# Patient Record
Sex: Male | Born: 1960 | Race: White | Hispanic: No
Health system: Western US, Academic
[De-identification: ages and names within clinical notes are randomized; demographics above are authoritative.]

## PROBLEM LIST (undated history)

## (undated) DIAGNOSIS — K759 Inflammatory liver disease, unspecified: Secondary | ICD-10-CM

## (undated) DIAGNOSIS — R012 Other cardiac sounds: Secondary | ICD-10-CM

## (undated) DIAGNOSIS — J309 Allergic rhinitis, unspecified: Secondary | ICD-10-CM

## (undated) DIAGNOSIS — J449 Chronic obstructive pulmonary disease, unspecified: Secondary | ICD-10-CM

## (undated) HISTORY — DX: Chronic obstructive pulmonary disease, unspecified: J44.9

## (undated) HISTORY — DX: Inflammatory liver disease, unspecified: K75.9

## (undated) HISTORY — DX: Allergic rhinitis, unspecified: J30.9

## (undated) HISTORY — DX: Other cardiac sounds: R01.2

## (undated) MED ORDER — PROAIR HFA 108 (90 BASE) MCG/ACT IN AERS
INHALATION_SPRAY | RESPIRATORY_TRACT | Status: AC
Start: 2014-11-28 — End: ?

---

## 2007-11-23 ENCOUNTER — Encounter (HOSPITAL_BASED_OUTPATIENT_CLINIC_OR_DEPARTMENT_OTHER): Payer: BLUE CROSS/BLUE SHIELD

## 2008-01-04 ENCOUNTER — Other Ambulatory Visit (HOSPITAL_BASED_OUTPATIENT_CLINIC_OR_DEPARTMENT_OTHER): Payer: BLUE CROSS/BLUE SHIELD

## 2008-01-04 ENCOUNTER — Encounter (HOSPITAL_BASED_OUTPATIENT_CLINIC_OR_DEPARTMENT_OTHER): Payer: BLUE CROSS/BLUE SHIELD

## 2008-01-04 ENCOUNTER — Other Ambulatory Visit (HOSPITAL_BASED_OUTPATIENT_CLINIC_OR_DEPARTMENT_OTHER): Payer: Self-pay | Admitting: Gastroenterology

## 2008-01-05 LAB — PR ULTRASOUND ABDOMINAL R-T W/IMAGE LIMITED

## 2008-01-06 LAB — PATHOLOGY, SURGICAL

## 2008-01-14 ENCOUNTER — Encounter (HOSPITAL_BASED_OUTPATIENT_CLINIC_OR_DEPARTMENT_OTHER): Payer: BLUE CROSS/BLUE SHIELD | Admitting: Gastroenterology

## 2011-10-02 ENCOUNTER — Ambulatory Visit (INDEPENDENT_AMBULATORY_CARE_PROVIDER_SITE_OTHER): Payer: BLUE CROSS/BLUE SHIELD | Admitting: Family

## 2011-10-02 ENCOUNTER — Encounter (INDEPENDENT_AMBULATORY_CARE_PROVIDER_SITE_OTHER): Payer: Self-pay | Admitting: Family

## 2011-10-02 ENCOUNTER — Encounter (INDEPENDENT_AMBULATORY_CARE_PROVIDER_SITE_OTHER): Payer: Self-pay

## 2011-10-02 VITALS — BP 152/81 | HR 63 | Temp 97.4°F | Resp 12 | Ht 73.0 in | Wt 216.0 lb

## 2011-10-02 LAB — X-RAY CHEST 2 VW

## 2011-10-02 LAB — PSA, DIAGNOSTIC/MONITORING: PSA, Diagnostic/Monitoring: 1.02 ng/mL (ref 0.00–4.00)

## 2011-10-02 LAB — COMPREHENSIVE METABOLIC PANEL
ALT (GPT): 63 U/L — ABNORMAL HIGH (ref 10–48)
AST (GOT): 33 U/L (ref 15–40)
Albumin: 3.8 g/dL (ref 3.5–5.2)
Alkaline Phosphatase (Total): 48 U/L (ref 39–139)
Anion Gap: 5 (ref 3–11)
Bilirubin (Total): 0.8 mg/dL (ref 0.2–1.3)
Calcium: 9.3 mg/dL (ref 8.9–10.2)
Carbon Dioxide, Total: 30 mEq/L (ref 22–32)
Chloride: 104 mEq/L (ref 98–108)
Creatinine: 0.85 mg/dL (ref 0.51–1.18)
GFR, Calc, African American: >60 mL/min (ref >59)
GFR, Calc, European American: >60 mL/min (ref >59)
Glucose: 80 mg/dL (ref 62–125)
Potassium: 4.4 mEq/L (ref 3.7–5.2)
Protein (Total): 6.6 g/dL (ref 6.0–8.2)
Sodium: 139 mEq/L (ref 136–145)
Urea Nitrogen: 12 mg/dL (ref 8–21)

## 2011-10-02 LAB — CBC, DIFF
% Basophils: 0 % (ref 0–1)
% Eosinophils: 2 % (ref 0–7)
% Immature Granulocytes: 0 % (ref 0–1)
% Lymphocytes: 24 % (ref 19–53)
% Monocytes: 12 % (ref 5–13)
% Neutrophils: 62 % (ref 34–71)
Absolute Eosinophil Count: 0.11 10*3/uL (ref 0.00–0.50)
Absolute Lymphocyte Count: 1.67 10*3/uL (ref 1.00–4.80)
Basophils: 0.03 10*3/uL (ref 0.00–0.20)
Hematocrit: 43 % (ref 38–50)
Hemoglobin: 14.8 g/dL (ref 13.0–18.0)
Immature Granulocytes: 0.01 10*3/uL (ref 0.00–0.05)
MCH: 32.3 pg (ref 27.3–33.6)
MCHC: 34.7 g/dL (ref 32.2–36.5)
MCV: 93 fL (ref 81–98)
Monocytes: 0.83 10*3/uL — ABNORMAL HIGH (ref 0.00–0.80)
Neutrophils: 4.41 10*3/uL (ref 1.80–7.00)
Platelet Count: 250 10*3/uL (ref 150–400)
RBC: 4.58 mil/uL (ref 4.40–5.60)
RDW-CV: 12.3 % (ref 11.6–14.4)
WBC: 7.06 10*3/uL (ref 4.3–10.0)

## 2011-10-02 LAB — TESTOSTERONE (ALL AGES): Testosterone: 3.9 ng/mL (ref 1.7–6.3)

## 2011-10-02 LAB — THYROID STIMULATING HORMONE: Thyroid Stimulating Hormone: 2.009 u[IU]/mL (ref 0.400–5.000)

## 2011-10-02 LAB — T3 (FREE): T3 (Free): 3.1 pg/mL (ref 2.3–3.9)

## 2011-10-02 LAB — T4, FREE: Thyroxine (Free): 0.7 ng/dL (ref 0.6–1.2)

## 2011-10-02 NOTE — Progress Notes (Signed)
SUBJECTIVE:    Ronald Page is a 50 year old male here for a wellness/preventive health care visit.     Additional concerns: heart rate drops. This happens several times per hour.  Doesn't stop, pause. Had EKG 5 yrs ago and it was normal. Really noticed it in [place] last week at [employer] in [place]. More fatigued lately. Was a smoker for awhile.     Current dietary habits: healthy diet in general ; red meat  Current exercise habits: yes, engages in regular exercise  Regular seat belt use: YES  Guns in the house: Yes: secured [employer] member  History sections of chart reviewed and updated today: yes    There are no active problems to display for this patient.      Current Outpatient Prescriptions   Medication Sig   . 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  OPHTHALMIC:  Need reading glasses;  Denies, any vision problems, blurry vision, diplopia, eye pain  ENT: no swallowing difficulties;  Denies and any ear nose or throat problems  RESPIRATORY:  No SOB when this occurs; no SOB with working out  Denies, dyspnea on exertion, dyspnea at rest, cough, wheezing  CARDIOVASCULAR: no swelling in the ankles; no Denies, chest pain or pressure at rest or during exercise, dyspnea, orthopnea, paroxysmal nocturnal dyspnea;   GI: Denies, change in appetite, dysphagia, nausea, vomiting, dyspepsia, abdominal pain, diarrhea, constipation, hematochezia, melena  GENITOURINARY: Denies, urethral discharge, erectile dysfunction, dysuria, any genito-urinary problems  INTEGUMENTARY: Denies, changes in moles or new moles, any skin problems  RHEUMATOLOGIC/MSK: Denies, joint swelling, joint stiffness, any joint or arthritic problems glucosamine/chondritin  ENDOCRINE: Denies, polyuria, polydipsia, heat intolerance, cold  intolerance no cold or head intolerance  PSYCHOSOCIAL: Denies, depressed mood, sleep disturbance and anxiety  Has been supplementing one coffee and three teas per day;       PHYSICAL EXAM:  BP 152/81  Pulse 63  Temp 97.4 F (36.3 C) (Oral)  Resp 12  Ht 6\' 1"  (1.854 m)  Wt 216 lb (97.977 kg)  BMI 28.50 kg/m2  SpO2 97%   General: no acute distress  Skin: Skin color, texture, turgor normal. No rashes or concerning lesions  Head: Normocephalic. No masses, lesions, tenderness or abnormalities  Eyes: Lids/periorbital skin normal, Conjunctivae/corneas clear, PERRL, EOM's intact  Ears: External ears normal. Canals clear. TM's normal.  Nose: normal  Oropharynx: normal  Teeth: normal dentition for age  Neck: supple. No adenopathy. Thyroid symmetric, normal size, without nodules  Lungs: clear to auscultation  Heart: no murmurs, clicks, or gallops, PMI normal and bradycardia noted on ekg. I did not hear nor see irregular heart rate at this time  Abd: soft, non-tender. BS normal. No masses or organomegaly  GU: Penis: no lesions or discharge. Testes: no masses or tenderness. No hernia.  Rectal: sphincter tone normal, no mass and stool guaiac negative (quality control done)  Prostate: normal size, no nodules or tenderness, symmetric  Spine: Back symmetric, no deformity; ROM normal; No CVA tenderness.  Ext: Normal, without deformities, edema, or skin discoloration, radial and DP pulses 2+ bilaterally  Neuro:  Grossly normal to observation, gait normal  -----------------------------------------    ASSESSMENT AND PLAN:  Health Maintenance   Topic Date Due   . Tetanus Booster  10/10/1970   . Cholesterol- Male  10/09/1993   . Colon Cancer Screening,fobt  10/09/2008   . Influenza > 6 Months  01/14/2012     Immunizations or studies due: None    No diagnosis found.     Preventive counseling: healthy dietary guidelines, proper exercise, and follow up with Cardiology October 16, 2011  Given history, will see if Cardiology wants me to  proceed with 24 hour holter monitor. Need to see results of the testing today first.  Ask him to avoid caffeine for now.

## 2011-10-02 NOTE — Patient Instructions (Addendum)
It was a pleasure to see you in clinic today.               I will send this information to your cardiologist   I will let you know results of your tests within 24 -48 hours  Please sign up for eCare so we can send them over the internet    Please journal your symptoms.  I am going to talk with cardiology about ordering the 24 hour holter monitor.   Should know by end of day.  If so, we will have you get this before July 3.    Be sure and drink 60 oz of fluids daily.      You can schedule an appointment to see [place] by calling [phone] or via eCare. If you are not yet signed up for eCare, please speak with a team member at the front desk.  eCare  enrollment will allow you to make appointments online, view test results and obtain a copy of our After Visit Summary.    If labs were ordered today the results are expected to be available via eCare 5 days later. If viewing your results on eCare, please make sure to scroll all the way to the bottom of the results to view any messages written by your provider.  If you have an active eCare account, this is where we will submit your results.  If you wish to receive the results over the phone or via letter, please call our office directly at [phone] and leave a message for your provider.    Otherwise, result letters are mailed 7-14 days after your tests are completed. If your physician needs to change your care based on your results, you will receive a phone call to notify you. If you haven't heard from him/her and it has been more than 14 days please give [place] a call.     Medication Refills: If you need to get a prescription refill, please contact your pharmacy 1 week before your current supply will run out to request the refill.  Contacting your pharmacy is the fastest and safest way to obtain a medication refill.  The pharmacy will notify our office.  Please note, that a minimum of 48 to 72 hours is needed to refill a medication.  Please call your pharmacy early to  allow enough time to refill before you anticipate running out.    We know you have a choice in where you receive your healthcare and we sincerely thank you for choosing [facility].

## 2011-10-03 ENCOUNTER — Encounter (INDEPENDENT_AMBULATORY_CARE_PROVIDER_SITE_OTHER): Payer: Self-pay | Admitting: Family

## 2011-10-04 LAB — VITAMIN D (25 HYDROXY)
Vit D (25_Hydroxy) Total: 22.7 ng/mL (ref 20.1–50.0)
Vitamin D2 (25_Hydroxy): <1 ng/mL
Vitamin D3 (25_Hydroxy): 22.7 ng/mL

## 2011-10-08 ENCOUNTER — Encounter (INDEPENDENT_AMBULATORY_CARE_PROVIDER_SITE_OTHER): Payer: Self-pay | Admitting: Family

## 2011-10-08 NOTE — Telephone Encounter (Signed)
Ronald Page see Pt's e-care message and advise

## 2011-10-09 NOTE — Telephone Encounter (Signed)
Please fax a copy of [name] ekg and clinic note to the [facility] Cardiology clinic where he will be seen on July 3.    Thank you.

## 2011-10-10 NOTE — Telephone Encounter (Signed)
Sent records to [facility] at [phone].

## 2011-10-10 NOTE — Telephone Encounter (Signed)
To [facility] to fax as below.

## 2011-10-16 ENCOUNTER — Ambulatory Visit: Payer: BLUE CROSS/BLUE SHIELD | Attending: Cardiovascular Disease

## 2011-10-16 DIAGNOSIS — I4949 Other premature depolarization: Secondary | ICD-10-CM | POA: Insufficient documentation

## 2011-11-15 ENCOUNTER — Ambulatory Visit (INDEPENDENT_AMBULATORY_CARE_PROVIDER_SITE_OTHER): Payer: BLUE CROSS/BLUE SHIELD | Admitting: Family

## 2011-11-15 ENCOUNTER — Encounter (INDEPENDENT_AMBULATORY_CARE_PROVIDER_SITE_OTHER): Payer: Self-pay | Admitting: Family

## 2011-11-15 ENCOUNTER — Ambulatory Visit (INDEPENDENT_AMBULATORY_CARE_PROVIDER_SITE_OTHER): Payer: Self-pay | Admitting: Family

## 2011-11-15 VITALS — BP 110/86 | HR 64 | Temp 97.4°F | Wt 214.0 lb

## 2011-11-15 MED ORDER — FLUCONAZOLE 150 MG OR TABS
ORAL_TABLET | ORAL | Status: DC
Start: 2011-11-15 — End: 2013-04-02

## 2011-11-15 MED ORDER — KETOCONAZOLE 2 % EX CREA
TOPICAL_CREAM | CUTANEOUS | Status: DC
Start: 2011-11-15 — End: 2013-04-02

## 2011-11-15 NOTE — Patient Instructions (Addendum)
It was a pleasure to see you in clinic today.               Avoid white sugar and white flour  Cinnamon pills may be helpful once daily     Keep skin away from skin  Power under arm and continue with other hygiene activities        You can schedule an appointment to see [place] by calling [phone] or via eCare. If you are not yet signed up for eCare, please speak with a team member at the front desk.  eCare  enrollment will allow you to make appointments online, view test results and obtain a copy of our After Visit Summary.    If labs were ordered today the results are expected to be available via eCare 5 days later. If viewing your results on eCare, please make sure to scroll all the way to the bottom of the results to view any messages written by your provider.  If you have an active eCare account, this is where we will submit your results.  If you wish to receive the results over the phone or via letter, please call our office directly at [phone] and leave a message for your provider.    Otherwise, result letters are mailed 7-14 days after your tests are completed. If your physician needs to change your care based on your results, you will receive a phone call to notify you. If you haven't heard from him/her and it has been more than 14 days please give [place] a call.     Medication Refills: If you need to get a prescription refill, please contact your pharmacy 1 week before your current supply will run out to request the refill.  Contacting your pharmacy is the fastest and safest way to obtain a medication refill.  The pharmacy will notify our office.  Please note, that a minimum of 48 to 72 hours is needed to refill a medication.  Please call your pharmacy early to allow enough time to refill before you anticipate running out.    We know you have a choice in where you receive your healthcare and we sincerely thank you for choosing [facility].     Index   Ringworm   What is ringworm?      Ringworm is a rash caused by a fungus that has infected your skin. (Despite its name, this rash is not caused by a worm or parasite.)   How does it occur?   Ringworm is spread by contact with an infected person or infected surface, such as clothes, towels, and bedding. It is more common among people participating in sports that involve a lot of contact with other people, such as wrestling. Children going to day care and people living in crowded conditions are also more likely to get ringworm.   Because ringworm is caused by a fungus it is more likely to occur in warm humid climates where fungus grows more easily.   Ringworm on the skin is called tinea corporis. When ringworm is on the feet, it is called tinea pedis, and when it is on the scalp, it is called tinea capitis. The fungus can also infect the inner thighs and groin. This type of ringworm is called jock itch or tinea cruris.   What are the symptoms?   The rash caused by a ringworm infection is usually round or oval and has a raised border. It starts small and slowly grows larger. As  it grows, the central part of the rash usually becomes clear. The rash may itch and the skin may become scaly. There may be some small, pus-filled bumps. Over time the rash spreads from one part of the body to other parts.   Ringworm on the scalp usually causes patches of hair loss.   How is it diagnosed?   Your healthcare provider will ask about your symptoms and examine you. Your provider may scrape the skin and look at it under a microscope or use an ultraviolet (UV) light to look for ringworm on the scalp.   How is it treated?   The treatment of ringworm depends on your health and how much the infection has spread on your skin or scalp. Most of the time putting an antifungal cream such as Tinactin, Micatin, or Lotrimin on the area of the rash once or twice a day is all that is needed. Rub the cream in well and apply it to an inch beyond the border of the rash. It's  important to keep using the medicine for a week after you no longer see a rash to make sure it's completely gone.   In some cases, your healthcare provider may prescribe a medicine to take by mouth. You may be given medicine, for example, if you have the rash on your scalp or in several places or if your immune system is weak.   How long will the effects last?   Ringworm may take several weeks to clear up with a cream, depending on the extent of the rash. If you are given an oral medicine, it may clear up faster. It is common to get it again after you've had it. Sometimes it becomes a long-term problem.   How can I take care of myself?    Try to keep your skin dry. Fungus likes to grow on moist skin.    Use the medicine as prescribed. If you are using the cream, remember to rub it in well.    For scalp infections, shampoo your hair every day. It may help to have your hair cut short but don't shave your head.    If you have ringworm in your beard and decide to shave your beard instead of just cutting it short, use an electric razor instead of a blade.   What can I do to help prevent ringworm?    Wash all your clothes, towels, and bedding that might have come into contact with the infection.    If you participate in sports such as wrestling, gymnastics, or martial arts, make sure the mats are cleaned regularly.    Don't share personal-care products or clothes with others if you or they have a rash.    Wash your hands thoroughly after touching an infected area.   Written by Arnette Norris, MD.   Published by [employer].   Last modified: 2007-11-30   Last reviewed: 2007-08-28   This content is reviewed periodically and is subject to change as new health information becomes available. The information is intended to inform and educate and is not a replacement for medical evaluation, advice, diagnosis or treatment by a healthcare professional.   Sports Medicine Advisor 2009.3 Index  Sports Medicine Advisor 2009.3  Credits    2009 [employer] and/or its affiliates. All Rights Reserved.

## 2011-11-15 NOTE — Progress Notes (Signed)
Ronald Page is  here with chief complaint:      #1 Rash under the arms  Duration Early June, 2013 and got worse while on [address]; very hot and perspiring significantly  Quality Itches, some burning; no sign of infection;   Timing: always there; not spreading any more  Severity: 2  Context: No prior history of rash; using neosporin but not improving; prediabetes note; patient changed diet while traveling; sitting outside long periods of time in heat;   Modifying factors: No change in deodorants, no change in soaps;        Past Medical History   Diagnosis Date   . Allergic rhinitis, cause unspecified    . Chronic obstructive asthma, unspecified    . Other abnormal heart sounds    . Hepatitis, unspecified      No past surgical history on file.    Patient Active Problem List   Diagnosis   . Bradycardia   . Fatigue   . Hepatitis C     Current Outpatient Prescriptions   Medication Sig   . ALBUTEROL IN prn       Allergies-Review of patient's allergies indicates no known allergies.    ROS: Please refer to HPI    PHYSICAL EXAM:  General: healthy, alert, no distress  Skin: Under each arm in axillary region there is a red, flakey rash round with six distinct annual well marginated areas on right and two on the left; no drainage or abcesses and no follicultis    Reviewed last glucose level in June, 2013 - level was 80.    ASSESSMENT/PLAN:      782.1 Rash and nonspecific skin eruption  (primary encounter diagnosis)  Comment: Tinea type  782.1 Rash and nonspecific skin eruption  (primary encounter diagnosis)    Plan: Fluconazole 150 MG Oral Tab, Ketoconazole 2 %         External Cream  Follow up as needed    Recommend avoiding white flour and white sugar                Refer to Patient instruction on AVS as well    Health Maintenance:

## 2011-11-21 ENCOUNTER — Other Ambulatory Visit: Payer: Self-pay

## 2011-12-20 ENCOUNTER — Encounter (INDEPENDENT_AMBULATORY_CARE_PROVIDER_SITE_OTHER): Payer: Self-pay | Admitting: Family

## 2011-12-21 NOTE — Telephone Encounter (Signed)
Medication Refill Documentation  Name of Medication Acyclovir  Prescribing Provider NA  Date last filled Historical  Date last seen for this issue Historical

## 2011-12-23 MED ORDER — ACYCLOVIR 800 MG OR TABS
ORAL_TABLET | ORAL | Status: DC
Start: 2011-12-23 — End: 2013-03-03

## 2011-12-23 NOTE — Telephone Encounter (Signed)
Rx was sent for ACYCLOVIR

## 2012-01-15 ENCOUNTER — Encounter (INDEPENDENT_AMBULATORY_CARE_PROVIDER_SITE_OTHER): Payer: Self-pay

## 2012-08-12 ENCOUNTER — Encounter (INDEPENDENT_AMBULATORY_CARE_PROVIDER_SITE_OTHER): Payer: Self-pay | Admitting: Family

## 2012-08-12 ENCOUNTER — Ambulatory Visit (INDEPENDENT_AMBULATORY_CARE_PROVIDER_SITE_OTHER): Payer: BLUE CROSS/BLUE SHIELD | Admitting: Family

## 2012-08-12 VITALS — BP 137/98 | HR 77 | Temp 98.5°F | Resp 12 | Ht 73.0 in | Wt 214.0 lb

## 2012-08-12 NOTE — Progress Notes (Signed)
Ronald Page is  here with chief complaint:      #1 Lesion on the right top of head x 1 year  Duration 1 yr  Quality changing slightly  Timing:   Severity:   Context:   Modifying factors:     #2: Blood pressure slightly elevated  Duration   Quality   Timing:   Severity:   Context:   Modifying factors:       Past Medical History   Diagnosis Date   . Allergic rhinitis, cause unspecified    . Chronic obstructive asthma, unspecified    . Other abnormal heart sounds    . Hepatitis, unspecified      No past surgical history on file.    Patient Active Problem List   Diagnosis   . Bradycardia   . Fatigue   . Hepatitis C     Current Outpatient Prescriptions   Medication Sig   . ALBUTEROL IN prn   . Fluconazole 150 MG Oral Tab 1 TABLET now   . Ketoconazole 2 % External Cream apply twice daily to affected area     No current facility-administered medications for this visit.       Allergies-Review of patient's allergies indicates no known allergies.    ROS: Please refer to HPI    PHYSICAL EXAM:  General: healthy, alert, no distress  HEAD: There is a flat, neutral colored irregular shaped mole on the right parietal region of his head and anterior section. Nontender and non-flaky. It is measures at 1 cm diameter. Blanches well. No discharge. There is some brown discoloration along 30% of the edges.  Skin: Skin color, texture, turgor normal. No rashes or concerning lesions except irregular flat brown mole on the right side of his face.    ASSESSMENT/PLAN:      (709.9) Skin lesion of scalp  (primary encounter diagnosis)  Plan: REFERRAL TO DERMATOLOGY  Explained the importance of having the mole on his head rechecked by dermatology. Possibly needs biopsy and complete removal.  I did perform cryotherapy 5 times on this lesion and he tolerated it well    (V77.91) Screening, lipid  Plan: CANCELED: Lipid Panel  Patient decided not to have these done today    (V02.62) Hepatitis C carrier  Plan: CANCELED: HEPATIC FUNCTION  PANEL  Patient decided not to have these done today              Refer to Patient instruction on AVS as well

## 2012-08-12 NOTE — Patient Instructions (Signed)
It was a pleasure to see you in clinic today.                  If you are not yet signed up for eCare, please speak with a team member at the front desk who would happy to assist you with this process.      eCare  enrollment will allow you access to the below benefits   You can make appointments online   View test results / Lab Results   Obtain a copy of our After Visit Summary (a summary of your visit today)     Your Test Results:  If labs were ordered today the results are expected to be available via eCare in about 5 days. If you have an active eCare account, this is how we will notify you of your results.     If you do not have an eCare account then your test results will be mailed to you within about 14 days after your tests are completed. If your physician needs to change your care based on your results or is concerned, you will receive a phone call.     If you have any questions about your test results please schedule an appointment with your provider.    **If it has been more than 2 weeks and you have not received your test results please send our office a message via eCare.    Medication Refills: If you need a prescription refilled, please contact your pharmacy 1 week before your current supply will run out to request the refill.  Contacting your pharmacy is the fastest and safest way to obtain a medication refill.  The pharmacy will notify our office.  Please note, that a minimum of 48 to 72 hours is needed to refill a medication, but refills are usually processed and sent to your pharmacy in about 5 business days.  Please call your pharmacy early to allow enough time to refill before you anticipate running out.  For faster medication refills, you can also schedule an appointment with your provider.    We know you have a choice in where you receive your healthcare and we sincerely thank you for trusting [facility] with your health.

## 2012-11-04 ENCOUNTER — Other Ambulatory Visit: Payer: Self-pay

## 2012-11-04 ENCOUNTER — Encounter (INDEPENDENT_AMBULATORY_CARE_PROVIDER_SITE_OTHER): Payer: Self-pay | Admitting: Family

## 2012-11-04 MED ORDER — ALBUTEROL SULFATE HFA 108 (90 BASE) MCG/ACT IN AERS
INHALATION_SPRAY | RESPIRATORY_TRACT | Status: DC
Start: 2012-11-04 — End: 2013-08-28

## 2012-11-04 NOTE — Telephone Encounter (Signed)
Medication Refill Documentation  Name of Medication Albuterol  Prescribing Provider Fonnie Mu, ARNP  Date last  filled Never looks like outside Dr  Date last seen for this issue 08/12/2012  Pharmacy loaded: Yes

## 2012-11-16 ENCOUNTER — Encounter (INDEPENDENT_AMBULATORY_CARE_PROVIDER_SITE_OTHER): Payer: Self-pay | Admitting: Family

## 2012-11-16 NOTE — Telephone Encounter (Signed)
Forwarding to [name] Meyer and the referral pool.

## 2012-11-16 NOTE — Telephone Encounter (Signed)
I completed the referral and let the patient know.      Thank you

## 2012-11-16 NOTE — Telephone Encounter (Signed)
Pleas see pt message below. Is it ok for me to send referral ?

## 2012-11-17 NOTE — Telephone Encounter (Signed)
Thank you :)

## 2012-12-19 ENCOUNTER — Encounter (INDEPENDENT_AMBULATORY_CARE_PROVIDER_SITE_OTHER): Payer: Self-pay | Admitting: Family

## 2012-12-28 ENCOUNTER — Encounter (HOSPITAL_BASED_OUTPATIENT_CLINIC_OR_DEPARTMENT_OTHER): Payer: Self-pay

## 2012-12-28 ENCOUNTER — Ambulatory Visit: Payer: BLUE CROSS/BLUE SHIELD | Attending: Internal Medicine | Admitting: Gastroenterology

## 2012-12-28 VITALS — BP 150/89 | HR 74 | Temp 95.7°F | Ht 72.0 in | Wt 213.2 lb

## 2012-12-28 DIAGNOSIS — B1920 Unspecified viral hepatitis C without hepatic coma: Secondary | ICD-10-CM | POA: Insufficient documentation

## 2012-12-28 DIAGNOSIS — B182 Chronic viral hepatitis C: Secondary | ICD-10-CM | POA: Insufficient documentation

## 2012-12-28 LAB — CBC (HEMOGRAM)
Hematocrit: 42 % (ref 38–50)
Hemoglobin: 14.8 g/dL (ref 13.0–18.0)
MCH: 32.3 pg (ref 27.3–33.6)
MCHC: 35.1 g/dL (ref 32.2–36.5)
MCV: 92 fL (ref 81–98)
Platelet Count: 235 10*3/uL (ref 150–400)
RBC: 4.58 mil/uL (ref 4.40–5.60)
RDW-CV: 12.4 % (ref 11.6–14.4)
WBC: 7.99 10*3/uL (ref 4.3–10.0)

## 2012-12-28 LAB — HEPATIC FUNCTION PANEL
ALT (GPT): 66 U/L — ABNORMAL HIGH (ref 10–48)
AST (GOT): 41 U/L — ABNORMAL HIGH (ref 15–40)
Albumin: 3.8 g/dL (ref 3.5–5.2)
Alkaline Phosphatase (Total): 44 U/L (ref 39–139)
Bilirubin (Direct): 0.1 mg/dL (ref 0.0–0.3)
Bilirubin (Total): 0.5 mg/dL (ref 0.2–1.3)
Protein (Total): 7 g/dL (ref 6.0–8.2)

## 2012-12-28 LAB — BASIC METABOLIC PANEL
Anion Gap: 7 (ref 3–11)
Calcium: 9 mg/dL (ref 8.9–10.2)
Carbon Dioxide, Total: 24 mEq/L (ref 22–32)
Chloride: 107 mEq/L (ref 98–108)
Creatinine: 1.13 mg/dL (ref 0.51–1.18)
GFR, Calc, African American: >60 mL/min (ref >59)
GFR, Calc, European American: >60 mL/min (ref >59)
Glucose: 154 mg/dL — ABNORMAL HIGH (ref 62–125)
Potassium: 3.9 mEq/L (ref 3.7–5.2)
Sodium: 138 mEq/L (ref 136–145)
Urea Nitrogen: 13 mg/dL (ref 8–21)

## 2012-12-28 LAB — PROTHROMBIN TIME
Prothrombin INR: 1 (ref 0.8–1.3)
Prothrombin Time Patient: 13.2 s (ref 10.7–15.6)

## 2012-12-28 NOTE — Progress Notes (Addendum)
HEPATOLOGY CLINIC NOTE    PROVIDERS  PCP Eliane Decree    REASON FOR CONSULT  Hepatits C; consideration for treatment    IDENTIFICATION/CHIEF COMPLAINT  [name] is a 52 year old male with a PMH of chronic hepatitis C, genotype 1, grade 1, stage 0 based on percutaneous liver biopsy 12/2007, sent in consultation by Ms. Meyer today to discuss hepatitis C treatment options.    HPI  [name] is a 52 year old male as above with a PMH of exercise-induced asthma here today to discuss hepatitis C treatment.    Patient was initially seen in hepatology clinic 12/2007.  He was diagnosed with HCV, genotype 1, near 1990 after attempting to donate blood.  He reports contracting the virus from a few possible different options including rare intranasal cocaine use in the 1980s, a tattoo he obtained in the 1980s, and blood products he received as part of surgery for a ruptured appendix in the early 1980s.  He has not had any obvious complications from HCV and previously delayed treatment as he felt well and his disease activity was minimal.  Per previous notes, HCV viral loads have hovered in the 100,000 range but the exact number and timing of these tests are unknown.    Patient reports feeling very well since he was last seen in hepatology clinic 01/2008.  Although he notes fatigue, he is very active and exercises nearly every day during the week.  He has continued to feel well without obvious complications from or evidence of decompensated disease or cirrhosis.  He has a good appetite.  His exercise is a mix of aerobic and resistance training.  He eats healthy.  No chest pain/SOB except for mild exercise or cold-weather related asthma.  No nausea or vomiting.  No abdominal pain.  No fever or chills.  Good energy levels with exercise although no longer with the same level of stamina he previously noted.    Patient is very hopeful of having his HCV treated.  He is otherwise feeling and doing well  today.    PAST MEDICAL HISTORY  Patient Active Problem List    Diagnosis Date Noted   . Bradycardia [427.89] 10/02/2011   . Fatigue [780.79] 10/02/2011   . Hepatitis C [070.70] 10/02/2011     Past Medical History   Diagnosis Date   . Allergic rhinitis, cause unspecified    . Chronic obstructive asthma, unspecified    . Other abnormal heart sounds    . Hepatitis, unspecified      ALLERGIES  Review of patient's allergies indicates:  No Known Allergies    MEDICATIONS  Current Outpatient Prescriptions   Medication Sig   . Albuterol Sulfate HFA 108 (90 BASE) MCG/ACT Inhalation Aero Soln 2 puffs 15 minutes before exercise OR every 4 hours as needed for asthma   . Fluconazole 150 MG Oral Tab 1 TABLET now   . Ketoconazole 2 % External Cream apply twice daily to affected area     No current facility-administered medications for this visit.     SOCIAL HISTORY  History   Substance Use Topics   . Smoking status: Former Games developer   . Smokeless tobacco: Not on file   . Alcohol Use: Yes     Used intranasal cocaine and marijuana in the 1980s but none since.  Obtained a tattoo in the 1980s.  Received blood products via transfusion secondary to a ruptured appendix in the early 1980s.  Drinks 2  to 4 glasses of EtOH/year.    His wife is a former Facilities manager at [employer] and has just transitioned to a new position in [place].    FAMILY HISTORY  No known FH of GI/liver disease or malignancy.    REVIEW OF SYSTEMS  A complete ROS was done and was negative other than per HPI above.    PHYSICAL EXAM  Filed Vitals:    12/28/12 1404   BP: 150/89   Pulse: 74   Temp: 95.7 F (35.4 C)   TempSrc: Temporal   Height: 6' (1.829 m)   Weight: 213 lb 3.2 oz (96.707 kg)     CONSTITUTIONAL/GENERAL APPEARANCE: pleasant, conversant, well-appearing male in NAD  MENTAL STATUS/PSYCH: Appropriate affect  NEURO:  Alert and oriented.  No gross deficits.  No asterixis.  EYES: No scleral icterus.  EARS, NOSE, THROAT: MMM, no OP lesions.  RESPIRATORY:  CTAB.  CARDIOVASCULAR (heart, pulses, edema): RRR, no m/r/g. No peripheral edema.  ABDOMEN/GI: Soft, nontender, nondistended, with no organomegaly or masses.  No scars.  MUSCULOSKELETAL: Good muscle tone and bulk.  SKIN: No jaundice or stigmata of chronic liver disease.    LABORATORY TESTS/STUDIES    Office Visit on 10/02/2011   Component Date Value   . General Diagnostic Text 10/02/2011                      Value:Examination:                          XR CHEST 2 VIEWS                                                                                Indication:                          fatigue; irregular heart rate unexplained                                                                                                           Comparison:                          None.                                                     Findings:                          The heart and mediastinum are normal.  The lungs and pleural spaces are clear. There is no indication of pneumonia,                           congestive heart failure, or other acute process.                                                      The soft tissues and bones of the thorax are unremarkable.                              . Impression 10/02/2011                      Value:Impression:                          1. No evidence of acute cardiopulmonary disease.                              . WBC 10/02/2011 7.06    . RBC 10/02/2011 4.58    . Hemoglobin 10/02/2011 14.8    . Hematocrit 10/02/2011 43    . MCV 10/02/2011 93    . [facility] 10/02/2011 32.3    . MCHC 10/02/2011 34.7    . Platelet Count 10/02/2011 250    . RDW-CV 10/02/2011 12.3    . % Total Neut. 10/02/2011 62    . % Lymphocytes 10/02/2011 24    . % Monocytes 10/02/2011 12    . % Eosinophils 10/02/2011 2    . % Basophils 10/02/2011 0    . % Immature Granulocytes 10/02/2011 0    . Neutrophils 10/02/2011 4.41    . Lymphocytes 10/02/2011 1.67    . Monocytes  10/02/2011 0.83*   . Eosinophils 10/02/2011 0.11    . Basophils 10/02/2011 0.03    . Immature Granulocytes 10/02/2011 0.01    . Thyroid Stimulating Horm* 10/02/2011 2.009    . Thyroxine (Free) 10/02/2011 0.7    . T3 (Free) 10/02/2011 3.1    . Prostate Specific Antigen 10/02/2011 1.02    . Vitamin D2 (25_Hydroxy) 10/02/2011 <1.0    . Vitamin D3 (25_Hydroxy) 10/02/2011 22.7    . Vit D (25_Hydroxy) Total 10/02/2011 22.7    . Sodium 10/02/2011 139    . Potassium 10/02/2011 4.4    . Chloride 10/02/2011 104    . Carbon Dioxide, Total 10/02/2011 30    . Anion Gap 10/02/2011 5    . Glucose 10/02/2011 80    . Urea Nitrogen 10/02/2011 12    . Creatinine 10/02/2011 0.85    . Protein (Total) 10/02/2011 6.6    . Albumin 10/02/2011 3.8    . Bilirubin (Total) 10/02/2011 0.8    . Calcium 10/02/2011 9.3    . AST (GOT) 10/02/2011 33    . Alkaline Phosphatase (To* 10/02/2011 48    . ALT (GPT) 10/02/2011 63*   . Gfr, Calc, European Amer* 10/02/2011 >60    . GFR, Calc, African Ameri* 10/02/2011 >60    . Testosterone 10/02/2011 3.9      ASSESSMENT/PLAN  [name] is a 52 year old male with a  PMH of chronic hepatitis C, genotype 1, grade 1, stage 0 on percutaneous liver biopsy 12/2007, here today to discuss treatment options.    Patient is currently doing and feeling very well.  There are no concerns for cirrhosis although the patient is in need of updated laboratory tests.  He appears to be a great treatment candidate for HCV treatment.  Given he has genotype 1 disease without cirrhosis based onliver  biopsy 2009, he would likely be best treated via an interferon-free regimen currently still in the process of being reviewed but likely to be FDA approved this fall which would include sofosbuvir/ledipisvir or possibly daclatisvir.  Patient is in need of updated labs and an HCV viral load.  As well, we don't have confirmation of his genotype which we should obtain in our system given consideration for treatment. We will also  submit his name to our [facility] Viral Hepatitis Research Program, as he is also interested in participating in a clinical trial.    RECOMMENDATIONS  1.  Updated labs today to include a CBC, BMP, hepatic function pane, INR, HCV viral load, and HCV genotype.  2.  Follow-up to discuss treatment options in 3 to 4 months in HCV treatment Clinic.  3.  Continue efforts at eating healthy, exercising.  4.  Strict alcohol avoidance.    Thank you for allowing [place] to participate in the care of this patient. Please do not hesitate to call with any questions or concerns.    -------------------------------------------  Attending: Leonia Reader, MD  I saw and evaluated the patient and agree with Dr. Jonelle Sports note.   Additional diagnoses: none  ----------------------------------------

## 2012-12-28 NOTE — Patient Instructions (Signed)
1.  Liver-related blood work today.  2.  Continue efforts at eating healthy, exercising regularly, and avoiding alcohol.  3.  Follow-up in 3 to 4 months in hepatitis C treatment clinic to discuss treatment options.

## 2012-12-29 LAB — HEPATITIS C RNA, QUANT
Hcv RNA Iu/Ml (Log 10): 4.7
Hepatitis C Quant Result: 53569 IU/mL — AB

## 2012-12-30 LAB — HEPATITIS C RNA GENOTYPING

## 2013-01-13 ENCOUNTER — Encounter (HOSPITAL_BASED_OUTPATIENT_CLINIC_OR_DEPARTMENT_OTHER): Payer: Self-pay | Admitting: Gastroenterology

## 2013-02-18 ENCOUNTER — Other Ambulatory Visit: Payer: Self-pay

## 2013-03-03 ENCOUNTER — Other Ambulatory Visit (INDEPENDENT_AMBULATORY_CARE_PROVIDER_SITE_OTHER): Payer: Self-pay | Admitting: Family

## 2013-03-03 ENCOUNTER — Encounter (INDEPENDENT_AMBULATORY_CARE_PROVIDER_SITE_OTHER): Payer: Self-pay | Admitting: Family

## 2013-03-03 NOTE — Telephone Encounter (Signed)
Medication Refill Documentation  Name of Medication acyclovir  Prescribing Provider Fonnie Mu, ARNP  Date last filled 12/23/11  Date last seen for this issue Never  Pharmacy loaded: [facility] [place]

## 2013-03-04 MED ORDER — ACYCLOVIR 800 MG OR TABS
800.0000 mg | ORAL_TABLET | Freq: Two times a day (BID) | ORAL | Status: DC
Start: 2013-03-04 — End: 2013-03-08

## 2013-03-04 NOTE — Telephone Encounter (Signed)
Patient last seen on 07/2012 for acute care and was to return in 3 months for wellness exam.  One refill authorized.  Please schedule follow up visit.

## 2013-03-05 NOTE — Telephone Encounter (Signed)
Spoke to patient  Scheduled for 04/02/13 at 9:00 am

## 2013-03-08 ENCOUNTER — Other Ambulatory Visit (INDEPENDENT_AMBULATORY_CARE_PROVIDER_SITE_OTHER): Payer: Self-pay | Admitting: Family

## 2013-03-08 MED ORDER — ACYCLOVIR 800 MG OR TABS
800.0000 mg | ORAL_TABLET | Freq: Two times a day (BID) | ORAL | Status: DC
Start: 2013-03-08 — End: 2014-09-29

## 2013-03-30 ENCOUNTER — Other Ambulatory Visit: Payer: Self-pay

## 2013-04-02 ENCOUNTER — Ambulatory Visit: Payer: BLUE CROSS/BLUE SHIELD | Attending: Registered Nurse | Admitting: Registered Nurse

## 2013-04-02 ENCOUNTER — Encounter (HOSPITAL_BASED_OUTPATIENT_CLINIC_OR_DEPARTMENT_OTHER): Payer: Self-pay | Admitting: Registered Nurse

## 2013-04-02 ENCOUNTER — Ambulatory Visit (INDEPENDENT_AMBULATORY_CARE_PROVIDER_SITE_OTHER): Payer: BLUE CROSS/BLUE SHIELD | Admitting: Family

## 2013-04-02 VITALS — BP 143/85 | HR 71 | Temp 98.4°F | Ht 72.0 in | Wt 212.1 lb

## 2013-04-02 DIAGNOSIS — B1920 Unspecified viral hepatitis C without hepatic coma: Secondary | ICD-10-CM | POA: Insufficient documentation

## 2013-04-02 MED ORDER — LEDIPASVIR-SOFOSBUVIR 90-400 MG OR TABS
1.0000 | ORAL_TABLET | Freq: Every day | ORAL | Status: DC
Start: 2013-04-02 — End: 2013-07-20

## 2013-04-02 NOTE — Progress Notes (Signed)
HEPATOLOGY CLINIC NOTE      REASON FOR CONSULT  Hepatits C; consideration for treatment    IDENTIFICATION/CHIEF COMPLAINT  [name] is a 52 year old male with a PMH of chronic hepatitis C, genotype 1, grade 1, stage 0 based on percutaneous liver biopsy 12/2007, sent to discuss hepatitis C treatment options.     Patient was initially seen in hepatology clinic 12/2007.  He was diagnosed with HCV, genotype 1, near 1990 after attempting to donate blood.  He reports contracting the virus from a few possible different options including rare intranasal cocaine use in the 1980s, a tattoo he obtained in the 1980s, and blood products he received as part of surgery for a ruptured appendix in the early 1980s.  He has not had any obvious complications from HCV and previously delayed treatment as he felt well and his disease activity was minimal.  Per previous notes, HCV viral loads have hovered in the 100,000 range but the exact number and timing of these tests are unknown.    Patient reports feeling very well since he was last seen in hepatology clinic 01/2008.  Although he notes fatigue, he is very active and exercises nearly every day during the week.  He has continued to feel well without obvious complications from or evidence of decompensated disease or cirrhosis.  He has a good appetite.  His exercise is a mix of aerobic and resistance training.  He eats healthy.  No chest pain/SOB except for mild exercise or cold-weather related asthma.  No nausea or vomiting.  No abdominal pain.  No fever or chills.  Good energy levels with exercise although no longer with the same level of stamina he previously noted.    Patient is very hopeful of having his HCV treated.  He is otherwise feeling and doing well today.    PAST MEDICAL HISTORY   Diagnosis   . Bradycardia [427.89]   . Fatigue [780.79]   . Hepatitis C [070.70]     Diagnosis   . Allergic rhinitis, cause unspecified   . Chronic obstructive asthma, unspecified   .  Other abnormal heart sounds     ALLERGIES  No Known Allergies    MEDICATIONS  Current Outpatient Prescriptions   Medication Sig   . Acyclovir 800 MG Oral Tab Take 1 tablet (800 mg) by mouth 2 times a day. For 5 days for outbreaks.   . Albuterol Sulfate HFA 108 (90 BASE) MCG/ACT Inhalation Aero Soln 2 puffs 15 minutes before exercise OR every 4 hours as needed for asthma   . Aspirin 81 MG Oral Tab Take 81 mg by mouth daily.   . Glucosamine 500 MG Oral Cap Take 500 mg by mouth 2 times a day.       PHYSICAL EXAM  Filed Vitals:    04/02/13 1044   BP: 143/85   Pulse: 71   Temp: 98.4 F (36.9 C)   TempSrc: Temporal   Height: 6' (1.829 m)   Weight: 212 lb 1.3 oz (96.199 kg)   SpO2: 98%     CONSTITUTIONAL/GENERAL APPEARANCE: pleasant, conversant, well-appearing male in NAD  MENTAL STATUS/PSYCH: Appropriate affect  NEURO:  Alert and oriented.  No gross deficits.  No asterixis.  EYES: No scleral icterus.    LABS: 12/29/12  HCV RNA Genotype Result 1a  Hepatitis C Quant Result 53569 (A) NDET IU/mL Hcv RNA Iu/Ml (Log 10) 4.7   Albumin 3.8 3.5 - 5.2 g/dL     Protein (Total)  7.0 6.0 - 8.2 g/dL     Bilirubin (Total) 0.5 0.2 - 1.3 mg/dL     Bilirubin (Direct) 0.1 0.0 - 0.3 mg/dL     Alkaline Phosphatase (Total) 44 39 - 139 U/L     AST (GOT) 41 (H) 15 - 40 U/L     ALT (GPT) 66 (H) 10 - 48 U/L           ASSESSMENT/PLAN  [name] is a 52 year old male with a PMH of chronic hepatitis C, genotype 1, grade 1, stage 0 on percutaneous liver biopsy 12/2007, here today to discuss treatment options. He is treatment naive.    Patient is currently doing and feeling very well.  There are no concerns for cirrhosis and he appears to be a great treatment candidate for HCV treatment.  Given he has genotype 1 disease without cirrhosis based on liver  biopsy 2009, He would be a good candidate for sofosbuvir/ledipisvir (Harvoni). His treatment course will be for 8 to 12 weeks.     We will submit a PA to his insurance company and see if the  medication will be covered. If it is covered he will be contacted by [place] to arrange for teaching on how to use the drug and follow-up.

## 2013-06-18 ENCOUNTER — Encounter (INDEPENDENT_AMBULATORY_CARE_PROVIDER_SITE_OTHER): Payer: Self-pay | Admitting: Family

## 2013-06-20 NOTE — Progress Notes (Signed)
This patient failed a scheduled appointment today.  Disposition: No Show

## 2013-07-09 ENCOUNTER — Other Ambulatory Visit (INDEPENDENT_AMBULATORY_CARE_PROVIDER_SITE_OTHER): Payer: Self-pay | Admitting: Family

## 2013-07-09 ENCOUNTER — Ambulatory Visit (INDEPENDENT_AMBULATORY_CARE_PROVIDER_SITE_OTHER): Payer: BLUE CROSS/BLUE SHIELD | Admitting: Family

## 2013-07-09 ENCOUNTER — Encounter (INDEPENDENT_AMBULATORY_CARE_PROVIDER_SITE_OTHER): Payer: Self-pay | Admitting: Family

## 2013-07-09 VITALS — BP 146/81 | HR 70 | Temp 97.4°F | Wt 214.4 lb

## 2013-07-09 DIAGNOSIS — H6980 Other specified disorders of Eustachian tube, unspecified ear: Secondary | ICD-10-CM

## 2013-07-09 DIAGNOSIS — H9313 Tinnitus, bilateral: Secondary | ICD-10-CM

## 2013-07-09 DIAGNOSIS — I10 Essential (primary) hypertension: Secondary | ICD-10-CM

## 2013-07-09 DIAGNOSIS — J4520 Mild intermittent asthma, uncomplicated: Secondary | ICD-10-CM

## 2013-07-09 DIAGNOSIS — H6982 Other specified disorders of Eustachian tube, left ear: Secondary | ICD-10-CM

## 2013-07-09 DIAGNOSIS — J45909 Unspecified asthma, uncomplicated: Secondary | ICD-10-CM | POA: Insufficient documentation

## 2013-07-09 DIAGNOSIS — H9192 Unspecified hearing loss, left ear: Secondary | ICD-10-CM

## 2013-07-09 DIAGNOSIS — R059 Cough, unspecified: Secondary | ICD-10-CM

## 2013-07-09 DIAGNOSIS — H9190 Unspecified hearing loss, unspecified ear: Secondary | ICD-10-CM | POA: Insufficient documentation

## 2013-07-09 DIAGNOSIS — R053 Chronic cough: Secondary | ICD-10-CM

## 2013-07-09 LAB — COMPREHENSIVE METABOLIC PANEL
ALT (GPT): 89 U/L — ABNORMAL HIGH (ref 10–48)
AST (GOT): 43 U/L — ABNORMAL HIGH (ref 9–38)
Albumin: 4.4 g/dL (ref 3.5–5.2)
Alkaline Phosphatase (Total): 56 U/L (ref 39–139)
Anion Gap: 6 (ref 4–12)
Bilirubin (Total): 0.7 mg/dL (ref 0.2–1.3)
Calcium: 9.4 mg/dL (ref 8.9–10.2)
Carbon Dioxide, Total: 30 mEq/L (ref 22–32)
Chloride: 102 mEq/L (ref 98–108)
Creatinine: 1.01 mg/dL (ref 0.51–1.18)
GFR, Calc, African American: >60 mL/min (ref >59)
GFR, Calc, European American: >60 mL/min (ref >59)
Glucose: 69 mg/dL (ref 62–125)
Potassium: 4.2 mEq/L (ref 3.6–5.2)
Protein (Total): 7.2 g/dL (ref 6.0–8.2)
Sodium: 138 mEq/L (ref 135–145)
Urea Nitrogen: 13 mg/dL (ref 8–21)

## 2013-07-09 LAB — THYROID STIMULATING HORMONE: Thyroid Stimulating Hormone: 2.435 u[IU]/mL (ref 0.400–5.000)

## 2013-07-09 MED ORDER — FLUTICASONE PROPIONATE 50 MCG/ACT NA SUSP
2.0000 | Freq: Two times a day (BID) | NASAL | Status: DC
Start: 2013-07-09 — End: 2014-09-29

## 2013-07-09 NOTE — Progress Notes (Signed)
Hearing Test Results:   RIGHT EAR   20dB HL - 500 yes - 1000 yes - 2000 yes - 4000 no  LEFT EAR   20dB HL - 500 yes - 1000 yes - 2000 yes - 4000 noHEARING TEST    RIGHT EAR  25dB HL - 500 yes - 1000 yes - 2000 yes - 4000 no  LEFT EAR   25dB HL - 500 yes - 1000 yes - 2000 yes - 4000 noHEARING TEST    RIGHT EAR  40dB HL - 500 yes - 1000 yes - 2000 yes - 4000 no  LEFT EAR   40dB HL - 500 yes - 1000 yes - 2000 yes - 4000 noHEARING TEST

## 2013-07-09 NOTE — Progress Notes (Signed)
Patient is here for noting new High blood pressure at home -  He has had high blood pressure in the past.   More easily fatigued when working out now and he is concerned.  Longer to recover if he skips working out.     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  No dizziness. No headache.  No change in cognition. No change in sleep pattern.  No swelling in lower legs   No chest pain at rest or working out.    No hx high cholesterol.  Cholesterol was WNL in fall at work.    No hx of thyroid difficulty. Weight steady; waist is the same.  Vision is about the same   No headaches   + some nasal congestion in the morning.  Mucous congestion sleeping -    Clean room -   Son has some allergies.    + hx of 25 yrs of smoking    Patient Active Problem List   Diagnosis   . Bradycardia   . Fatigue   . Hepatitis C   . HTN (hypertension)   . Asthma   . Hearing loss     Outpatient Prescriptions Prior to Visit   Medication Sig Dispense Refill   . Acyclovir 800 MG Oral Tab Take 1 tablet (800 mg) by mouth 2 times a day. For 5 days for outbreaks.  30 tablet  3   . Albuterol Sulfate HFA 108 (90 BASE) MCG/ACT Inhalation Aero Soln 2 puffs 15 minutes before exercise OR every 4 hours as needed for asthma  1 Inhaler  3   . Aspirin 81 MG Oral Tab Take 81 mg by mouth daily.       . Glucosamine 500 MG Oral Cap Take 500 mg by mouth 2 times a day.       . Ledipasvir-Sofosbuvir (HARVONI) 90-400 MG Oral Tab Take 1 tablet by mouth daily.  28 tablet  2     No facility-administered medications prior to visit.     Physical Exam   Constitutional: He is oriented to person, place, and time. He appears well-developed and well-nourished. No distress.   HENT:   Head: Normocephalic.   Right Ear: External ear normal.   Left Ear: External ear normal.   Nose: Nose normal.   Mouth/Throat: No oropharyngeal exudate.   Eyes: Conjunctivae are normal. Pupils are equal, round, and reactive to light.   Neck: Normal range of motion. No thyromegaly  present.   Cardiovascular: Normal rate, regular rhythm and normal heart sounds.    No murmur heard.  Pulmonary/Chest: Effort normal and breath sounds normal. No respiratory distress. He has no wheezes. He has no rales. He exhibits no tenderness.   Abdominal: Soft.   Neurological: He is alert and oriented to person, place, and time. No cranial nerve deficit.   Psychiatric: He has a normal mood and affect.       A/P  Total time of 25  minutes was spent face-to-face with the patient, of which more than 50% was spent counseling and coordinating care  as outlined in this note. We discussed what b/p means and how his smoking hx may be impacting elevation of b/p now.      1. HTN (hypertension)  Review diet  - COMPREHENSIVE METABOLIC PANEL  - X-RAY CHEST 2 VW    2. Asthma, mild intermittent, uncomplicated  Long term smoker; COPD?    3. Hearing loss, left  RIGHT EAR  20dB HL - 500 yes - 1000 yes - 2000 yes - 4000 no   LEFT EAR   20dB HL - 500 yes - 1000 yes - 2000 yes - 4000 noHEARING TEST   RIGHT EAR   25dB HL - 500 yes - 1000 yes - 2000 yes - 4000 no   LEFT EAR   25dB HL - 500 yes - 1000 yes - 2000 yes - 4000 noHEARING TEST   RIGHT EAR   40dB HL - 500 yes - 1000 yes - 2000 yes - 4000 no   LEFT EAR   40dB HL - 500 yes - 1000 yes - 2000 yes - 4000 noHEARING TEST    - SCREENING TEST PURE TONE AIR ONLY  - THYROID STIMULATING HORMONE    4. Eustachian tube dysfunction, left  - Fluticasone Propionate 50 MCG/ACT Nasal Suspension; Spray 2 sprays into each nostril 2 times a day.  Dispense: 1 bottle; Refill: 3    5. Chronic cough  Increase fluids  May require PFTs  - X-RAY CHEST 2 VW      Results for orders placed in visit on 07/09/13   THYROID STIMULATING HORMONE       Result Value Ref Range    Thyroid Stimulating Hormone 2.435  0.400 - 5.000 uIU/mL   COMPREHENSIVE METABOLIC PANEL       Result Value Ref Range    Sodium 138  135 - 145 mEq/L    Potassium 4.2  3.6 - 5.2 mEq/L    Chloride 102  98 - 108 mEq/L    Carbon Dioxide, Total 30   22 - 32 mEq/L    Anion Gap 6  4 - 12    Glucose 69  62 - 125 mg/dL    Urea Nitrogen 13  8 - 21 mg/dL    Creatinine 1.611.01  0.960.51 - 1.18 mg/dL    Protein (Total) 7.2  6.0 - 8.2 g/dL    Albumin 4.4  3.5 - 5.2 g/dL    Bilirubin (Total) 0.7  0.2 - 1.3 mg/dL    Calcium 9.4  8.9 - 04.510.2 mg/dL    AST (GOT) 43 (*) 9 - 38 U/L    Alkaline Phosphatase (Total) 56  39 - 139 U/L    ALT (GPT) 89 (*) 10 - 48 U/L    Gfr, Calc, European American >60  >59 mL/min    GFR, Calc, African American >60  >59 mL/min    GFR, Information        Value: Calculated GFR in mL/min/1.73 m2 by MDRD equation.    Inaccurate with changing renal function.  See   [email or web address]         Follow up in 1 week

## 2013-07-09 NOTE — Patient Instructions (Signed)
It was a pleasure to see you in clinic today.                  If you are not yet signed up for eCare, please speak with a team member at the front desk who would happy to assist you with this process.      eCare  enrollment will allow you access to the below benefits   You can make appointments online   View test results / Lab Results   Obtain a copy of our After Visit Summary (a summary of your visit today)     Your Test Results:  If labs were ordered today the results are expected to be available via eCare in about 5 days. If you have an active eCare account, this is how we will notify you of your results.     If you do not have an eCare account then your test results will be mailed to you within about 14 days after your tests are completed. If your physician needs to change your care based on your results or is concerned, you will receive a phone call.     If you have any questions about your test results please schedule an appointment with your provider.    **If it has been more than 2 weeks and you have not received your test results please send our office a message via eCare.    Medication Refills: If you need a prescription refilled, please contact your pharmacy 1 week before your current supply will run out to request the refill.  Contacting your pharmacy is the fastest and safest way to obtain a medication refill.  The pharmacy will notify our office.  Please note, that a minimum of 48 to 72 hours is needed to refill a medication, but refills are usually processed and sent to your pharmacy in about 5 business days.  Please call your pharmacy early to allow enough time to refill before you anticipate running out.  For faster medication refills, you can also schedule an appointment with your provider.    We know you have a choice in where you receive your healthcare and we sincerely thank you for trusting [facility] with your health.

## 2013-07-11 ENCOUNTER — Telehealth (INDEPENDENT_AMBULATORY_CARE_PROVIDER_SITE_OTHER): Payer: Self-pay | Admitting: Family

## 2013-07-11 NOTE — Telephone Encounter (Signed)
Patient was sent eCare message.

## 2013-07-12 ENCOUNTER — Other Ambulatory Visit: Payer: Self-pay

## 2013-07-12 ENCOUNTER — Encounter (INDEPENDENT_AMBULATORY_CARE_PROVIDER_SITE_OTHER): Payer: Self-pay | Admitting: Family

## 2013-07-12 DIAGNOSIS — H9313 Tinnitus, bilateral: Secondary | ICD-10-CM

## 2013-07-12 NOTE — Telephone Encounter (Signed)
See eCare mesg, pt would like to be referred to a hearing specialist for testing

## 2013-07-15 ENCOUNTER — Encounter (INDEPENDENT_AMBULATORY_CARE_PROVIDER_SITE_OTHER): Payer: BLUE CROSS/BLUE SHIELD

## 2013-07-20 ENCOUNTER — Ambulatory Visit (INDEPENDENT_AMBULATORY_CARE_PROVIDER_SITE_OTHER): Payer: BLUE CROSS/BLUE SHIELD | Admitting: Family Medicine

## 2013-07-20 ENCOUNTER — Encounter (INDEPENDENT_AMBULATORY_CARE_PROVIDER_SITE_OTHER): Payer: Self-pay | Admitting: Family Medicine

## 2013-07-20 VITALS — BP 149/71 | HR 61 | Temp 97.7°F | Ht 72.0 in | Wt 214.6 lb

## 2013-07-20 DIAGNOSIS — T23221A Burn of second degree of single right finger (nail) except thumb, initial encounter: Secondary | ICD-10-CM

## 2013-07-20 DIAGNOSIS — Z23 Encounter for immunization: Secondary | ICD-10-CM

## 2013-07-20 MED ORDER — HYDROCODONE-ACETAMINOPHEN 5-325 MG OR TABS
1.0000 | ORAL_TABLET | ORAL | Status: DC | PRN
Start: 2013-07-20 — End: 2014-09-29

## 2013-07-20 NOTE — Progress Notes (Signed)
Vaccine Screening Questions      1.  Have you had a serious reaction or an allergic reaction to a vaccine?  NO    2.  Currently have a moderate or severe illness, including fever? (Don't Ask if vaccine   ordered by provider same day)  NO    3.  Ever had a seizure or a brain or other nervous system problem syndrome associated with a vaccine? (DTaP/TDaP/DTP pertinent) NO    4.  Is patient receiving  any live vaccinations today? (Varicella-Chickenpox, MMR-Measles/Mumps/Rubella, Zoster-Shingles)  NO    If YES to any of the questions above - Do NOT give vaccine.  Consult with RN or provider in clinic.  (#4 can be YES if all Live vaccine questions are answered NO)    If NO to all questions above - Patient may receive vaccine.    5.  Do you need to receive the Flu vaccine today? NO    Vaccine information sheet(s) discussed, patient/parent/guardian verbalized understanding? YES     VIS given 07/20/2013 by Sarita Haver CMA.    Vaccine given today without initial adverse effect. YES    Sarita Haver, CMA
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
## 2013-07-20 NOTE — Patient Instructions (Signed)
It was a pleasure to see you in clinic today.                  If you are not yet signed up for eCare, please speak with a team member at the front desk who would happy to assist you with this process.      eCare  enrollment will allow you access to the below benefits   You can make appointments online   View test results / Lab Results   Obtain a copy of our After Visit Summary (a summary of your visit today)     Your Test Results:  If labs were ordered today the results are expected to be available via eCare in about 5 days. If you have an active eCare account, this is how we will notify you of your results.     If you do not have an eCare account then your test results will be mailed to you within about 14 days after your tests are completed. If your physician needs to change your care based on your results or is concerned, you will receive a phone call.     If you have any questions about your test results please schedule an appointment with your provider.    **If it has been more than 2 weeks and you have not received your test results please send our office a message via eCare.    Medication Refills: If you need a prescription refilled, please contact your pharmacy 1 week before your current supply will run out to request the refill.  Contacting your pharmacy is the fastest and safest way to obtain a medication refill.  The pharmacy will notify our office.  Please note, that a minimum of 48 to 72 hours is needed to refill a medication, but refills are usually processed and sent to your pharmacy in about 5 business days.  Please call your pharmacy early to allow enough time to refill before you anticipate running out.  For faster medication refills, you can also schedule an appointment with your provider.    We know you have a choice in where you receive your healthcare and we sincerely thank you for trusting [facility] with your health.

## 2013-08-06 ENCOUNTER — Ambulatory Visit (INDEPENDENT_AMBULATORY_CARE_PROVIDER_SITE_OTHER): Payer: BLUE CROSS/BLUE SHIELD | Admitting: Family

## 2013-08-06 ENCOUNTER — Encounter (INDEPENDENT_AMBULATORY_CARE_PROVIDER_SITE_OTHER): Payer: Self-pay | Admitting: Family

## 2013-08-06 VITALS — BP 156/92 | HR 78 | Temp 98.0°F | Wt 213.4 lb

## 2013-08-06 DIAGNOSIS — R0609 Other forms of dyspnea: Secondary | ICD-10-CM

## 2013-08-06 DIAGNOSIS — H9190 Unspecified hearing loss, unspecified ear: Secondary | ICD-10-CM

## 2013-08-06 DIAGNOSIS — R0683 Snoring: Secondary | ICD-10-CM

## 2013-08-06 DIAGNOSIS — H9319 Tinnitus, unspecified ear: Secondary | ICD-10-CM

## 2013-08-06 DIAGNOSIS — H9193 Unspecified hearing loss, bilateral: Secondary | ICD-10-CM

## 2013-08-06 DIAGNOSIS — R0989 Other specified symptoms and signs involving the circulatory and respiratory systems: Secondary | ICD-10-CM

## 2013-08-06 DIAGNOSIS — H9313 Tinnitus, bilateral: Secondary | ICD-10-CM

## 2013-08-06 NOTE — Progress Notes (Signed)
Reason for visit:  Follow up on hearing       Cervical screening/PAP: N/A  Mammo: N/A  Colon Screen: due for FOBT  Have seen a specialist since your last visit: NO     HM Due: LIPIDS, FOBT   Health Maintenance   Topic Date Due   . CHOLESTEROL- MALE  10/09/1993   . COLON CANCER SCREENING,FOBT/FIT  10/09/2008   . INFLUENZA >9 YRS  12/14/2013   . TETANUS BOOSTER  07/21/2023           Future Appointments  Date Time Provider Department Center   08/26/2013 9:30 AM Zeigler, Regine Renee Aleta [facility]

## 2013-08-06 NOTE — Progress Notes (Signed)
S: Continues to have b/p elevated at home.     He has cough in the morning. No SOB when working out.  Smoked [phone]  1/2 to 1 pack/day. He was diagnosed with asthma age 53. That's when he started ALbuterol. Using 3 x per day. No improvement in drainage. Premature ventricular contractions noted and he saw cardiology. He does not have high cholesterol and no other cardiac risk factors.    He does not experience tightness in the chest. He has no change when he is working out. He feels rested in the morning although he snores loudly.  Snores worse on back than lying on stomach.    [facility]       Patient Active Problem List   Diagnosis   . Bradycardia   . Fatigue   . Hepatitis C   . HTN (hypertension)   . Asthma   . Hearing loss     Outpatient Prescriptions Prior to Visit   Medication Sig Dispense Refill   . Acyclovir 800 MG Oral Tab Take 1 tablet (800 mg) by mouth 2 times a day. For 5 days for outbreaks.  30 tablet  3   . Albuterol Sulfate HFA 108 (90 BASE) MCG/ACT Inhalation Aero Soln 2 puffs 15 minutes before exercise OR every 4 hours as needed for asthma  1 Inhaler  3   . Aspirin 81 MG Oral Tab Take 81 mg by mouth daily.       . Fluticasone Propionate 50 MCG/ACT Nasal Suspension Spray 2 sprays into each nostril 2 times a day.  1 bottle  3   . Glucosamine 500 MG Oral Cap Take 500 mg by mouth 2 times a day.       . Hydrocodone-Acetaminophen 5-325 MG Oral Tab Take 1-2 tablets by mouth every 4 hours as needed for pain. For pain. Not to exceed 8 tablets per day.  20 tablet  0     No facility-administered medications prior to visit.       Objective:    He is very fit. Arm size does qualify for larger cuff. He is breathing without distress. B/P is high but was taken with regular size cuff.  B/P retaken after 3 minutes of slow deep breathing with large cough. B/P 128/82.   Pulse is strong.    Mood: normal.  BMI 29 average.    A/P    Total time of 25 minutes was spent face-to-face with the  patient, of which more than 50% was spent counseling and coordinating care  as outlined in this note.      1) See audiology  2) [facility] for snoring  3) Use large cuff at home and office.  4) Deep breathing  5) Continue flonase and add short term afrin    I believe his b/p is related to respiratory issues and not cardiac issues.  We will check again after he sees ENT  He will record b/p at home    Continue to work out  [employer] filter in bedroom

## 2013-08-06 NOTE — Patient Instructions (Signed)
It was a pleasure to see you in clinic today.                  If you are not yet signed up for eCare, please speak with a team member at the front desk who would happy to assist you with this process.      eCare  enrollment will allow you access to the below benefits   You can make appointments online   View test results / Lab Results   Obtain a copy of our After Visit Summary (a summary of your visit today)     Your Test Results:  If labs were ordered today the results are expected to be available via eCare in about 5 days. If you have an active eCare account, this is how we will notify you of your results.     If you do not have an eCare account then your test results will be mailed to you within about 14 days after your tests are completed. If your physician needs to change your care based on your results or is concerned, you will receive a phone call.     If you have any questions about your test results please schedule an appointment with your provider.    **If it has been more than 2 weeks and you have not received your test results please send our office a message via eCare.    Medication Refills: If you need a prescription refilled, please contact your pharmacy 1 week before your current supply will run out to request the refill.  Contacting your pharmacy is the fastest and safest way to obtain a medication refill.  The pharmacy will notify our office.  Please note, that a minimum of 48 to 72 hours is needed to refill a medication, but refills are usually processed and sent to your pharmacy in about 5 business days.  Please call your pharmacy early to allow enough time to refill before you anticipate running out.  For faster medication refills, you can also schedule an appointment with your provider.    We know you have a choice in where you receive your healthcare and we sincerely thank you for trusting  [facility] with your [facility].

## 2013-08-26 ENCOUNTER — Encounter (HOSPITAL_BASED_OUTPATIENT_CLINIC_OR_DEPARTMENT_OTHER): Payer: BLUE CROSS/BLUE SHIELD | Admitting: Audiologist

## 2013-08-28 ENCOUNTER — Other Ambulatory Visit (INDEPENDENT_AMBULATORY_CARE_PROVIDER_SITE_OTHER): Payer: Self-pay | Admitting: Family

## 2013-08-28 DIAGNOSIS — J4520 Mild intermittent asthma, uncomplicated: Secondary | ICD-10-CM

## 2013-08-30 MED ORDER — PROAIR HFA 108 (90 BASE) MCG/ACT IN AERS
INHALATION_SPRAY | RESPIRATORY_TRACT | Status: DC
Start: 2013-08-30 — End: 2013-09-02

## 2013-08-30 NOTE — Telephone Encounter (Signed)
Return in about 4 weeks (around 09/03/2013).

## 2013-09-01 ENCOUNTER — Encounter (INDEPENDENT_AMBULATORY_CARE_PROVIDER_SITE_OTHER): Payer: Self-pay | Admitting: Family

## 2013-09-01 ENCOUNTER — Other Ambulatory Visit (INDEPENDENT_AMBULATORY_CARE_PROVIDER_SITE_OTHER): Payer: Self-pay | Admitting: Family

## 2013-09-01 DIAGNOSIS — J4520 Mild intermittent asthma, uncomplicated: Secondary | ICD-10-CM

## 2013-09-01 NOTE — Telephone Encounter (Signed)
From: Ronald LevyBrian Gordon [name]  To: Eliane DecreeMeyer, [name] Ellen  Sent: 09/01/2013 5:52 AM PDT  Subject: Medication Renewal Request    Original authorizing provider: Babette RelicKerry E Meyer, ARNP    Ronald LevyBrian Gordon Page would like a refill of the following medications:  PROAIR HFA 108 (90 BASE) MCG/ACT Inhalation Aero Soln Babette Relic[[name] E Meyer, ARNP]    [facility]: [facility] [id number] [phone] [address] [place] [phone]     Comment:

## 2013-09-01 NOTE — Telephone Encounter (Signed)
Medication Dispense Information    PROAIR HFA 90 MCG INHALER    Dispensed: 08/31/2013 12:00 AM    Days supply: 16    Quantity: 8.5 Unspecified    Prescribed refills: 0    [id number] [phone] [address] [place] [phone]   [address]  [place] [phone]  Phone: [phone]  Fax: [phone]    Authorizing provider: Babette RelicMEYER, [name] E    Received from: [employer]

## 2013-09-01 NOTE — Telephone Encounter (Signed)
Routing to [facility] to assist.

## 2013-09-02 NOTE — Telephone Encounter (Signed)
See eCare, pt states she has not rec'vd her inhaler, she is asking for 2 inhalers to last her one month. Routing to R.R. DonnelleyKerry Meyer to advise.         A/P    Total time of 25 minutes was spent face-to-face with the patient, of which more than 50% was spent counseling and coordinating care as outlined in this note.      1) See audiology  2) [facility] sn[facility]  3) Use large cuff at home and office.  4) Deep breathing  5) Continue flonase and add short term afrin    I believe his b/p is related to respiratory issues and not cardiac issues.  We will check again after he sees ENT  He will record b/p at home    Continue to work out  [employer] filter in bedroom

## 2013-09-03 ENCOUNTER — Ambulatory Visit: Payer: BLUE CROSS/BLUE SHIELD | Admitting: Otolaryngology

## 2013-09-03 ENCOUNTER — Ambulatory Visit (HOSPITAL_BASED_OUTPATIENT_CLINIC_OR_DEPARTMENT_OTHER): Payer: BLUE CROSS/BLUE SHIELD

## 2013-09-03 ENCOUNTER — Encounter (HOSPITAL_BASED_OUTPATIENT_CLINIC_OR_DEPARTMENT_OTHER): Payer: Self-pay | Admitting: Otolaryngology

## 2013-09-03 VITALS — BP 145/83 | HR 74

## 2013-09-03 DIAGNOSIS — H9313 Tinnitus, bilateral: Secondary | ICD-10-CM

## 2013-09-03 DIAGNOSIS — IMO0001 Reserved for inherently not codable concepts without codable children: Secondary | ICD-10-CM

## 2013-09-03 DIAGNOSIS — H918X9 Other specified hearing loss, unspecified ear: Secondary | ICD-10-CM

## 2013-09-03 DIAGNOSIS — H9319 Tinnitus, unspecified ear: Secondary | ICD-10-CM | POA: Insufficient documentation

## 2013-09-03 NOTE — Progress Notes (Signed)
Patient referred for an audiogram by Glean Hess, MD.      S:  Patient reports tinnitus AU x2 years.  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  Patient reports one episode of vertigo about one year ago which lasted about 2 hours.      O/A: See THI (Grade I = Slight).  See tymps (R= Jerger Type A, L= Jerger Type AD) - of note, y-axis scaling is different on left vs right graph. .  See scanned copy of audiogram under media tab.    P: Follow-up with Dr. Delynn Flavin following audiogram.
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
## 2013-09-07 NOTE — Progress Notes (Signed)
Please see the completed Patient Health History - Otolaryngology form available as a scanned document in the patient's medical record.  This information was reviewed and confirmed by me with the patient today.  I saw and evaluated the patient. Discussed with resident and agree with resident's findings and plan as documented in the resident's note.     I have personally reviewed the history with the patient as well as going over the working diagnoses and management options. I have answered the patient's questions to the best of my ability.

## 2013-09-10 MED ORDER — ALBUTEROL SULFATE HFA 108 (90 BASE) MCG/ACT IN AERS
INHALATION_SPRAY | RESPIRATORY_TRACT | Status: DC
Start: 2013-09-10 — End: 2014-02-07

## 2013-10-19 ENCOUNTER — Encounter (INDEPENDENT_AMBULATORY_CARE_PROVIDER_SITE_OTHER): Payer: Self-pay | Admitting: Family

## 2013-10-19 NOTE — Progress Notes (Signed)
ROI Received on: 07/09/2013  Requested From: [facility]  Phone Number: [phone]  Fax Number: [phone]  1st Attempt faxed on (date): 10/19/2013

## 2013-10-27 NOTE — Progress Notes (Signed)
Records received on: 10/20/13  Results entered (yes/no): Yes, given to Florentina AddisonKatie to be scanned

## 2013-11-03 ENCOUNTER — Encounter (INDEPENDENT_AMBULATORY_CARE_PROVIDER_SITE_OTHER): Payer: Self-pay

## 2014-02-07 ENCOUNTER — Other Ambulatory Visit (INDEPENDENT_AMBULATORY_CARE_PROVIDER_SITE_OTHER): Payer: Self-pay | Admitting: Family

## 2014-02-07 DIAGNOSIS — J4520 Mild intermittent asthma, uncomplicated: Secondary | ICD-10-CM

## 2014-02-07 MED ORDER — ALBUTEROL SULFATE HFA 108 (90 BASE) MCG/ACT IN AERS
INHALATION_SPRAY | RESPIRATORY_TRACT | Status: DC
Start: 2014-02-07 — End: 2014-06-10

## 2014-02-07 NOTE — Telephone Encounter (Signed)
Medication Refill Documentation  Name of Medication Albuterol   Prescribing Provider Fonnie MuKerry Meyer, ARNP  Date last filled 09/10/2013  Date last seen for this issue 08/06/2013  [facility]

## 2014-02-09 ENCOUNTER — Other Ambulatory Visit (INDEPENDENT_AMBULATORY_CARE_PROVIDER_SITE_OTHER): Payer: Self-pay | Admitting: Family

## 2014-02-09 DIAGNOSIS — J4520 Mild intermittent asthma, uncomplicated: Secondary | ICD-10-CM

## 2014-02-10 NOTE — Telephone Encounter (Signed)
:   Receipt confirmed by pharmacy (02/07/2014 9:49 PM PDT)

## 2014-02-10 NOTE — Telephone Encounter (Signed)
From: Denny LevyBrian Gordon Haefner  To: Eliane DecreeMeyer, [name] Ellen  Sent: 02/09/2014 5:47 AM PDT  Subject: Medication Renewal Request    Original authorizing provider: Babette RelicKerry E Meyer, ARNP    Denny LevyBrian Gordon Jenkin would like a refill of the following medications:  Albuterol Sulfate HFA (PROAIR HFA) 108 (90 BASE) MCG/ACT Inhalation Aero Soln Babette Relic[[name] E Meyer, ARNP]    [facility] pharmacy[phone] [phone] [address] [place] [phone]     Comment:  Still waiting for approval for prescription. I am down to 1 day's worth of medicine.

## 2014-02-21 ENCOUNTER — Ambulatory Visit (INDEPENDENT_AMBULATORY_CARE_PROVIDER_SITE_OTHER): Payer: Self-pay | Admitting: Family

## 2014-03-18 ENCOUNTER — Other Ambulatory Visit: Payer: Self-pay

## 2014-03-21 ENCOUNTER — Encounter (INDEPENDENT_AMBULATORY_CARE_PROVIDER_SITE_OTHER): Payer: Self-pay | Admitting: Family

## 2014-03-21 DIAGNOSIS — B359 Dermatophytosis, unspecified: Secondary | ICD-10-CM

## 2014-03-21 MED ORDER — KETOCONAZOLE 2 % EX CREA
1.0000 | TOPICAL_CREAM | Freq: Two times a day (BID) | CUTANEOUS | Status: DC
Start: 2014-03-21 — End: 2014-09-29

## 2014-03-21 NOTE — Telephone Encounter (Signed)
Forwarding patient eCare message to [name] Meyer.

## 2014-06-10 ENCOUNTER — Other Ambulatory Visit: Payer: Self-pay | Admitting: Family

## 2014-06-10 DIAGNOSIS — J4520 Mild intermittent asthma, uncomplicated: Secondary | ICD-10-CM

## 2014-06-13 MED ORDER — ALBUTEROL SULFATE HFA 108 (90 BASE) MCG/ACT IN AERS
INHALATION_SPRAY | RESPIRATORY_TRACT | Status: DC
Start: 2014-06-13 — End: 2014-06-14

## 2014-06-14 ENCOUNTER — Other Ambulatory Visit (INDEPENDENT_AMBULATORY_CARE_PROVIDER_SITE_OTHER): Payer: Self-pay | Admitting: Family

## 2014-06-14 ENCOUNTER — Encounter (INDEPENDENT_AMBULATORY_CARE_PROVIDER_SITE_OTHER): Payer: Self-pay | Admitting: Family

## 2014-06-14 DIAGNOSIS — J4520 Mild intermittent asthma, uncomplicated: Secondary | ICD-10-CM

## 2014-06-14 NOTE — Telephone Encounter (Signed)
Replied

## 2014-06-14 NOTE — Telephone Encounter (Signed)
From: Ronald LevyBrian Gordon Diemer  To: Eliane DecreeMeyer, [name] Ellen, ARNP  Sent: 06/14/2014 5:23 AM PST  Subject: Medication Renewal Request    Original authorizing provider: Babette RelicKerry E Meyer, ARNP    Ronald Page would like a refill of the following medications:  Albuterol Sulfate HFA (PROAIR HFA) 108 (90 BASE) MCG/ACT Inhalation Aero Soln Babette Relic[[name] E Meyer, ARNP]    [facility]erred pharmacy: [facility] [phone] [phone] [address] [place] [phone]     Comment:  Need ASAP. Thank you.

## 2014-06-20 MED ORDER — ALBUTEROL SULFATE HFA 108 (90 BASE) MCG/ACT IN AERS
INHALATION_SPRAY | RESPIRATORY_TRACT | Status: DC
Start: 2014-06-20 — End: 2014-11-28

## 2014-08-30 ENCOUNTER — Ambulatory Visit (INDEPENDENT_AMBULATORY_CARE_PROVIDER_SITE_OTHER): Payer: BLUE CROSS/BLUE SHIELD | Admitting: Family

## 2014-08-30 ENCOUNTER — Encounter (INDEPENDENT_AMBULATORY_CARE_PROVIDER_SITE_OTHER): Payer: Self-pay | Admitting: Family

## 2014-08-30 VITALS — BP 134/88 | HR 57 | Temp 97.6°F | Wt 210.0 lb

## 2014-08-30 DIAGNOSIS — M7022 Olecranon bursitis, left elbow: Secondary | ICD-10-CM

## 2014-08-30 NOTE — Progress Notes (Signed)
Subjective    Denny LevyBrian Gordon Mkrtchyan is  here with chief complaint:    1) Left Swollen elbow - x 10 days.   HPI of the above condition (1-3):   Ice is not helping. No pain except with touching it. No hx of gout.   He does have Hepatitis C and does not use NSAID.  No redness or warmth. No fever. Has had bursitis int he past but not sac as it is today.    ROS:   No fatigue  No pain in forearm or wrist or hand.  No pain in shoulder  No numbness or tingling    Patient Active Problem List   Diagnosis   . Bradycardia   . Fatigue   . Hepatitis C   . HTN (hypertension)   . Asthma   . Hearing loss     Current Outpatient Prescriptions   Medication Sig Dispense Refill   . Acyclovir 800 MG Oral Tab Take 1 tablet (800 mg) by mouth 2 times a day. For 5 days for outbreaks. 30 tablet 3   . Albuterol Sulfate HFA (PROAIR HFA) 108 (90 BASE) MCG/ACT Inhalation Aero Soln inhale 2 puffs by mouth every 4 hours if needed and 15 MINUTES prior to exercise for asthma 8.5 g 2   . Aspirin 81 MG Oral Tab Take 81 mg by mouth daily.     . Fluticasone Propionate 50 MCG/ACT Nasal Suspension Spray 2 sprays into each nostril 2 times a day. 1 bottle 3   . Glucosamine 500 MG Oral Cap Take 500 mg by mouth 2 times a day.     . Hydrocodone-Acetaminophen 5-325 MG Oral Tab Take 1-2 tablets by mouth every 4 hours as needed for pain. For pain. Not to exceed 8 tablets per day. 20 tablet 0   . Ketoconazole 2 % External Cream Apply 1 application topically 2 times a day. Apply to skin rash for up to 2 weeks 60 g 1     No current facility-administered medications for this visit.       Allergies-Review of patient's allergies indicates no known allergies.    History sections of chart reviewed and updated today: Yes    Objective  PHYSICAL EXAM:  General: Pleasant, no distress, vitals stable   Skin: Left elbow no redness or rash. Golf ball size sac at [employer]  Right elbow: Normal  Left elbow: golf size sac bulging at olecranon joint. Limited range of motion with mild  pain rated 4/10. Mildly tender in antecubital space.     Medical Decision Making:    low complexity       ASSESSMENT/PLAN:      1. Olecranon bursitis, left elbow  Patient was moved to procedure room.  Left elbow was cleaned with alcohol.  Elbow was flexed to 90'.   Using 21 gauge needle ~ 4 cc of clear to bloody fluid were removed.   Patient tolerated well.  Compression wrap applied after antibiotic applied. Bleeding was minimal.    Patient was ask to follow up in 3 days  Provided hand out to evaluate for temperature, redness and other signs of infection        Patient was provided patient education and follow up instructions on AVS    Health Maintenance reviewed -  Health Maintenance Due   Topic   . HIV Screen    . Diabetes Screen    . Colon Cancer Screen w/ FOBT/FIT          1.  Olecranon bursitis, left elbow  Drained

## 2014-08-30 NOTE — Patient Instructions (Addendum)
It was a pleasure to see you in clinic today.               Ice 20 minutes on 3-4 x per day   Alleve generic ok 220 mg (2 tabs with food) twice daily x 5 days.   Bandage removed in the morning  If you start to develop fluid, please let me know.     If you are not yet signed up for eCare, please speak with a team member at the front desk who would happy to assist you with this process.      eCare  enrollment will allow you access to the below benefits   You can make appointments online   View test results / Lab Results   Obtain a copy of our After Visit Summary (a summary of your visit today)     Your Test Results:  If labs were ordered today the results are expected to be available via eCare in about 5 days. If you have an active eCare account, this is how we will notify you of your results.     If you do not have an eCare account then your test results will be mailed to you within about 14 days after your tests are completed. If your physician needs to change your care based on your results or is concerned, you will receive a phone call.     If you have any questions about your test results please schedule an appointment with your provider.    **If it has been more than 2 weeks and you have not received your test results please send our office a message via eCare.    Medication Refills: If you need a prescription refilled, please contact your pharmacy 1 week before your current supply will run out to request the refill.  Contacting your pharmacy is the fastest and safest way to obtain a medication refill.  The pharmacy will notify our office.  Please note, that a minimum of 48 to 72 hours is needed to refill a medication, but refills are usually processed and sent to your pharmacy in about 5 business days.  Please call your pharmacy early to allow enough time to refill before you anticipate running out.  For faster medication refills, you can also schedule an appointment with your provider.    We know you have a  choice in where you receive your healthcare and we sincerely thank you for trusting [facility] with your health.

## 2014-08-30 NOTE — Progress Notes (Signed)
Reason for visit: bursitis swelling right elbow x 10 days  Reviewed eCare status with Patient:  NO    HEALTH MAINTENANCE:  Has the patient had any of these since their last visit?    Cervical screening/PAP: N/A     Mammo: N/A    Colon Screen: due scheduled October 21, 2014 ph [phone] Dr. Modesto CharonWong    Have you seen a specialist since your last visit: No        HM Due:   Health Maintenance   Topic Date Due   . HIV Screen  [birth year]   . Diabetes Screen  10/10/1998   . Colon Cancer Screen w/ FOBT/FIT  03/24/2010   . Cholesterol Test  02/13/2018   . Tetanus Vaccine  07/21/2023   . Influenza Vaccine  Completed           Future Appointments  Date Time Provider Department Center   08/30/2014 2:00 PM Daphane ShepherdMeyer, Delcie RochKerry Ellen, ARNP [facility]

## 2014-09-02 NOTE — Addendum Note (Signed)
Addended by: Otho DarnerHOFFMAN, TANYA on: 09/02/2014 08:45 AM     Modules accepted: Orders

## 2014-09-05 ENCOUNTER — Ambulatory Visit (INDEPENDENT_AMBULATORY_CARE_PROVIDER_SITE_OTHER): Payer: BLUE CROSS/BLUE SHIELD | Admitting: Family Medicine

## 2014-09-05 VITALS — BP 144/90 | HR 59 | Temp 98.4°F | Resp 14 | Ht 72.0 in | Wt 207.0 lb

## 2014-09-05 DIAGNOSIS — M25521 Pain in right elbow: Secondary | ICD-10-CM

## 2014-09-05 DIAGNOSIS — M7021 Olecranon bursitis, right elbow: Secondary | ICD-10-CM

## 2014-09-05 NOTE — Patient Instructions (Signed)
It was a pleasure to see you in clinic today.                  If you are not yet signed up for eCare, please speak with a team member at the front desk who would happy to assist you with this process.      eCare  enrollment will allow you access to the below benefits   You can make appointments online   View test results / Lab Results   Obtain a copy of our After Visit Summary (a summary of your visit today)     Your Test Results:  If labs were ordered today the results are expected to be available via eCare in about 5 days. If you have an active eCare account, this is how we will notify you of your results.     If you do not have an eCare account then your test results will be mailed to you within about 14 days after your tests are completed. If your physician needs to change your care based on your results or is concerned, you will receive a phone call.     If you have any questions about your test results please schedule an appointment with your provider.    **If it has been more than 2 weeks and you have not received your test results please send our office a message via eCare.    Medication Refills: If you need a prescription refilled, please contact your pharmacy 1 week before your current supply will run out to request the refill.  Contacting your pharmacy is the fastest and safest way to obtain a medication refill.  The pharmacy will notify our office.  Please note, that a minimum of 48 to 72 hours is needed to refill a medication, but refills are usually processed and sent to your pharmacy in about 5 business days.  Please call your pharmacy early to allow enough time to refill before you anticipate running out.  For faster medication refills, you can also schedule an appointment with your provider.    We know you have a choice in where you receive your healthcare and we sincerely thank you for trusting [facility] with your health.

## 2014-09-05 NOTE — Progress Notes (Signed)
Reason for visit:  Bursitis right elbow worse symptoms than before   Reviewed eCare status with Patient:  NO    HEALTH MAINTENANCE:  Has the patient had any of these since their last visit?    Cervical screening/PAP: N/A     Mammo: N/A    Colon Screen: DUE - scheduled October 21, 2014 ph [phone] Dr. Modesto CharonWong    Have you seen a specialist since your last visit: No        HM Due:   Health Maintenance   Topic Date Due   . HIV Screen  24-Jan-1961   . Diabetes Screen  10/10/1998   . Colon Cancer Screen w/ FOBT/FIT  03/24/2010   . Cholesterol Test  02/13/2018   . Tetanus Vaccine  07/21/2023   . Influenza Vaccine  Completed           Future Appointments  Date Time Provider Department Center   09/05/2014 2:20 PM Diddee, Deatra JamesSeema, MD [facility]

## 2014-09-08 NOTE — Progress Notes (Signed)
S:pt is here for followup of swelling and discomfort in rt elbow  This was drained by Fonnie Mukerry meyer and diagnosis of olecrenon bursitis confirmed  He has been taking ibuprofen daily   The fluid has accumulated again  No pain till it  Bumps against something  No fever/chills  No pain elsewhere    O  BP 144/90 mmHg  Pulse 59  Temp(Src) 98.4 F (36.9 C)  Resp 14  Ht 6' (1.829 m)  Wt 207 lb (93.895 kg)  BMI 28.07 kg/m2  SpO2 98%  In gen iN NAD  Rt elbow,no redness but localised swelling over the olecrenon process  3-4 cm cystic nontender lump consistent with bursitis  FROM of elbow noted    Xray elbow shows calcification in triceps tendon close to attachment over the olecrenon process  A/P  (M25.521) Elbow pain, right  (primary encounter diagnosis)  Plan: X-RAY ELBOW 3 VW RIGHT, CANCELED: X-RAY ELBOW 2        VW RIGHT            (M70.21) Olecranon bursitis of right elbow  Plan: continue ice pack,NSAIDS and see specialist  REFERRAL TO SPORTS MEDICINE

## 2014-09-26 ENCOUNTER — Encounter (INDEPENDENT_AMBULATORY_CARE_PROVIDER_SITE_OTHER): Payer: No Typology Code available for payment source | Admitting: Physical Medicine & Rehabilitation

## 2014-09-26 NOTE — Progress Notes (Signed)
Ronald Page did not cancel and was not present for a scheduled appointment today.  Disposition: no show

## 2014-09-29 ENCOUNTER — Encounter (INDEPENDENT_AMBULATORY_CARE_PROVIDER_SITE_OTHER): Payer: Self-pay | Admitting: Family

## 2014-09-29 ENCOUNTER — Ambulatory Visit (INDEPENDENT_AMBULATORY_CARE_PROVIDER_SITE_OTHER): Payer: BLUE CROSS/BLUE SHIELD | Admitting: Family

## 2014-09-29 VITALS — BP 168/99 | HR 75 | Temp 98.0°F | Resp 16 | Wt 209.0 lb

## 2014-09-29 DIAGNOSIS — S139XXA Sprain of joints and ligaments of unspecified parts of neck, initial encounter: Secondary | ICD-10-CM

## 2014-09-29 DIAGNOSIS — M62838 Other muscle spasm: Secondary | ICD-10-CM

## 2014-09-29 MED ORDER — OXYCODONE HCL 5 MG OR TABS
5.0000 mg | ORAL_TABLET | ORAL | Status: DC | PRN
Start: 2014-09-29 — End: 2016-01-05

## 2014-09-29 MED ORDER — CYCLOBENZAPRINE HCL 10 MG OR TABS
10.0000 mg | ORAL_TABLET | Freq: Every evening | ORAL | Status: DC | PRN
Start: 2014-09-29 — End: 2016-01-05

## 2014-09-29 MED ORDER — DICLOFENAC SODIUM 1 % TD GEL
2.0000 g | Freq: Four times a day (QID) | TRANSDERMAL | Status: DC | PRN
Start: 2014-09-29 — End: 2016-01-05

## 2014-09-29 MED ORDER — DICLOFENAC SODIUM 75 MG OR TBEC
75.0000 mg | DELAYED_RELEASE_TABLET | Freq: Two times a day (BID) | ORAL | Status: DC
Start: 2014-09-29 — End: 2016-01-05

## 2014-09-29 NOTE — Progress Notes (Signed)
Patient Referred By: No ref. provider found  Patient's PCP: Eliane Decree, ARNP     Subjective:  Patient is a 54 year old male, here to discuss Neck Problem    The following portions of the patient's history were reviewed with the patient and updated as appropriate: problem list, current medications, allergies, past medical history, past surgical history, past social history and past family history.    Pt had violent sneeze and felt neck pop.  Since then pain has been gradually increasing    Neck Pain   This is a new problem. The current episode started in the past 7 days. The problem occurs constantly. The problem has been gradually worsening. Associated with: sneeze. The pain is present in the occipital region. The quality of the pain is described as aching and shooting. The pain is at a severity of 8/10 (0/10 when still, 8/10 with movement). The pain is moderate. The symptoms are aggravated by twisting and position. Pertinent negatives include no headaches, numbness, pain with swallowing, paresis, tingling, trouble swallowing, visual change or weakness. He has tried NSAIDs, heat and ice for the symptoms. The treatment provided mild relief.     Outpatient Prescriptions Prior to Visit   Medication Sig Dispense Refill   . Acyclovir 800 MG Oral Tab Take 1 tablet (800 mg) by mouth 2 times a day. For 5 days for outbreaks. 30 tablet 3   . Albuterol Sulfate HFA (PROAIR HFA) 108 (90 BASE) MCG/ACT Inhalation Aero Soln inhale 2 puffs by mouth every 4 hours if needed and 15 MINUTES prior to exercise for asthma 8.5 g 2   . Aspirin 81 MG Oral Tab Take 81 mg by mouth daily.     . Fluticasone Propionate 50 MCG/ACT Nasal Suspension Spray 2 sprays into each nostril 2 times a day. 1 bottle 3   . Glucosamine 500 MG Oral Cap Take 500 mg by mouth 2 times a day.     . Hydrocodone-Acetaminophen 5-325 MG Oral Tab Take 1-2 tablets by mouth every 4 hours as needed for pain. For pain. Not to exceed 8 tablets per day. 20 tablet 0   .  Ketoconazole 2 % External Cream Apply 1 application topically 2 times a day. Apply to skin rash for up to 2 weeks 60 g 1     No facility-administered medications prior to visit.     BP 168/99 mmHg  Pulse 75  Temp(Src) 98 F (36.7 C) (Temporal)  Resp 16  Wt 209 lb (94.802 kg)  SpO2 97%    Review of patient's allergies indicates:  No Known Allergies    Review of Systems   HENT: Negative for trouble swallowing.    Eyes: Negative for visual disturbance.   Musculoskeletal: Positive for neck pain.   Neurological: Negative for tingling, weakness, numbness and headaches.         Objective:  Physical Exam   Constitutional: He is oriented to person, place, and time. He appears well-developed and well-nourished. No distress.   HENT:   Head: Normocephalic and atraumatic.   Eyes: Conjunctivae are normal.   Neck: Phonation normal. Muscular tenderness present. No spinous process tenderness present. Carotid bruit is not present. No rigidity. Decreased range of motion (normal flexion, however conly 10 degrees ROM in all other plane with significant hesitation 2nd to pain) present. No edema and no erythema present. No thyroid mass and no thyromegaly present.       Cardiovascular: Normal rate, regular rhythm and normal heart sounds.  Pulmonary/Chest: Effort normal and breath sounds normal. No respiratory distress.   Musculoskeletal: He exhibits tenderness. He exhibits no edema.        Cervical back: Decreased range of motion: Normal flexion but 10 degree in all other planes.   Neurological: He is alert and oriented to person, place, and time. He has normal reflexes. No cranial nerve deficit. Coordination normal.   Skin: Skin is warm and dry. No erythema.   Psychiatric: He has a normal mood and affect. His behavior is normal.        Assessment and Plan:    1. Acute neck sprain, initial encounter  If not improvement in the next 3 days to return for follow-up.   - Cyclobenzaprine HCl (FLEXERIL) 10 MG Oral Tab; Take 1 tablet (10  mg) by mouth at bedtime as needed for muscle spasms. For muscle pain.  Dispense: 60 tablet; Refill: 0  - Diclofenac Sodium 1 % Transdermal Gel; Apply 2 g to affected area on neck 4 times a day as needed (pain).  Dispense: 3 Tube; Refill: 0  - Diclofenac Sodium 75 MG Oral Tab EC; Take 1 tablet (75 mg) by mouth 2 times a day.  Dispense: 60 tablet; Refill: 1  - REFERRAL TO MASSAGE THERAPY  - OxyCODONE HCl 5 MG Oral Tab; Take 1 tablet (5 mg) by mouth every 4 hours as needed for pain.  Dispense: 15 tablet; Refill: 0    2. Neck muscle spasm    - Cyclobenzaprine HCl (FLEXERIL) 10 MG Oral Tab; Take 1 tablet (10 mg) by mouth at bedtime as needed for muscle spasms. For muscle pain.  Dispense: 60 tablet; Refill: 0  - REFERRAL TO MASSAGE THERAPY

## 2014-09-29 NOTE — Patient Instructions (Addendum)
Dear Ronald Page,     It was a pleasure taking care of you today.  Below you will find information on your visit and any home instructions.  Please take the time to review your discharge paperwork.  If you have not already signed up for E-Care please do as this is the easiest and fastest way to communicate with me.  Once your lab results are ready I can also send them to you via E-Care for you to review.     You have sprained your neck and I have given you some pain medication and muscle relaxer to help with the pain.  If there is no improvement in the next 3 days or so please return for follow-up.     Do not hesitate to call with any questions or concerns.     [employer].       t was a pleasure to see you in clinic today. Your Medical Assistant was: Delmer Islam can schedule an appointment to see [place] by calling [phone] or via eCare.     If labs were ordered today the results are expected to be available via eCare 5 days later. Otherwise, result letters are mailed 7-10 days after your tests are completed. If your physician needs to change your care based on your results, you will receive a phone call to notify you. If you haven't heard from him/her and it has been more than 10 days please give [place] a call.     Thank you for choosing [facility].    Neck Sprain or Strain  A sudden force that causes turning or bending of the neck can cause sprain or strain. An example would be the force from a car accident. This can stretch or tear muscles called a strain. It can also stretch or tear ligaments called a sprain. Either of these can cause neck pain. Sometimes neck pain occurs after a simple awkward movement. In either case, muscle spasm is commonly present and contributes to the pain.    Unless you had a forceful physical injury (for example, a car accident or fall), X-rays are usually not ordered for the initial evaluation of neck pain. If pain continues and dose not respond to medical treatment,  X-rays and other tests may be performed at a later time.  Home care   You may feel more soreness and spasm the first few days after the injury. Rest until symptoms begin to improve.   When lying down, use a comfortable pillow or a rolled towelthat supports the head and keeps the spine in a neutral position. The position of the head should not be tilted forward or backward.   Apply an ice pack over the injured area for 15 to 20 minutes every3 to 6hours. You should do this forthe first24 to 48 hours.You can make an ice pack by filling a plastic bag that seals at the top with ice cubes and then wrapping it with a thin towel. After 48 hours, apply heat(warm shower orwarm bath)for 15 to 20 minutes several times a day, or alternate ice and heat.   You may useover-the-counter pain medicineto control pain, unless another pain medicine was prescribed. If you have chronic liver or kidney disease or ever had a stomach ulcer or GI bleeding, talk with your healthcare provider before using these medicines.   If a soft cervical collar was prescribed, it should be worn only for periods  of increased pain. It should not be worn for more than 3 hours a day, or for a period longer than 1 to 2 weeks.  Follow-up care  Follow up with yourhealthcare provideras directed. Physical therapy may be needed.  Sometimes fractures don't show up on the first X-ray. Bruises and sprains can sometimes hurt as much as a fracture. These injuries can take time to heal completely. If your symptoms don't improve or they get worse, talk with your healthcare provider. You may need a repeat X-rayor other tests. If X-rays were taken, you will be told of any new findings that may affect your care.  Call 911  Call 911 if you have:   Neck swelling, difficulty or painful swallowing   Difficulty breathing   Chest pain  When to seek medical advice  Call your healthcare provider right awayif any of these occur:   Pain becomes worse or spreads  into your arms   Weakness or numbness in one or both arms   2000-2015 [employer] [address], [place], [place] [address]. All rights reserved. This information is not intended as a substitute for professional medical care. Always follow your healthcare professional's instructions.        Neck Spasm    A spasm of the neck muscles can happen after a sudden awkward neck movement. Sleeping with your neck in a crooked position can also cause spasm. Some people respond to emotional stress by tensing the muscles of their neck, shoulders, and upper back. If neck spasm lasts long enough, it can cause headache.  The treatment described below will usually help the pain to go away in 5 to 7 days. Pain that continues may need further evaluation or other types of treatment such as physical therapy.  Home care   Rest and relax the muscles. Use a comfortable pillow that supports the head and keeps the spine in a neutral position. The position of the head should not be tilted forward or backward. A rolled up towel may help for a custom fit.   Some people find relief withheat. Heat can be applied with either a warm shower or bath ora moist towel heated in the microwaveandmassage.Others prefercold packs. You can make an ice pack by filling a plastic bag that seals at the top with ice cubes or crushed ice and then wrapping it with a thin towel.Try both and use the method that feels best for15 to20 minutes, several times a day.   Whether using ice or heat, be careful that you do not injure your skin. Never put ice directly on the skin. Always wrap the ice in a towel or other type of cloth.This is very important, especially in people with poor skin sensations.   Try to reduce your stress level. Emotional stress can lead to neck muscle tension and get in the way of or delay the healing process.   You may useover-the-counter pain medicine to control pain, unless another medicine was prescribed.If you  have chronic liver or kidney disease or ever had a stomach ulcer or GI bleeding, talk with your healthcare provider beforeusing these medicines.  Follow-up care  Follow up with your healthcare provider if your symptoms do not show signs of improvement after one week. Physical therapy or further tests may be needed.  If X-rays,CT scans, or MRI scanswere taken,you will be told of any new findings that may affect your care.  Call 911  Call 911 if you have:   Sudden weakness  or numbness in one or both arms   Neck swelling, difficulty or painful swallowing   Difficulty breathing   Chest pain  When to seek medical advice  Call your healthcare provider right away if any of these occur:   Pain becomes worse or spreads into one or both arms   Increasing headache with nausea or vomiting   Fever of 100.49F (38C) or above lasting for 24 to 48 hours   2000-2015 [employer] [address] [place], [place] [address]. All rights reserved. This information is not intended as a substitute for professional medical care. Always follow your healthcare professional's instructions.

## 2014-09-29 NOTE — Progress Notes (Signed)
Preventative Health Care Updates   Since your last visit, please tell us if you have had any of the below services outside of [facility]:                                                                            Cervical screening/PAP:N/A      Mammo: N/A      Colon Screen: NO   If yes, location/ date.    Specialty Care Updates  Have seen a specialist since your last visit: NO   If yes, Name, location and date.      HM Due:   Health Maintenance   Topic Date Due   . HIV Screen  19-Jan-1961   . Diabetes Screen  10/10/1998   . Colon Cancer Screen w/ FOBT/FIT  03/24/2010   . Cholesterol Test  02/13/2018   . Tetanus Vaccine  07/21/2023   . Influenza Vaccine  Completed           Future Appointments  Date Time Provider Department Center   09/29/2014 1:20 PM Khosa, Rugare M, ARNP [facility]

## 2014-09-29 NOTE — Telephone Encounter (Signed)
Routing to [facility] Nurse Triage Pool, please see patients eCare message to assist, thank you.

## 2014-11-17 ENCOUNTER — Other Ambulatory Visit: Payer: Self-pay

## 2014-11-24 ENCOUNTER — Ambulatory Visit (INDEPENDENT_AMBULATORY_CARE_PROVIDER_SITE_OTHER): Payer: BLUE CROSS/BLUE SHIELD | Admitting: Family

## 2014-11-28 ENCOUNTER — Other Ambulatory Visit (INDEPENDENT_AMBULATORY_CARE_PROVIDER_SITE_OTHER): Payer: Self-pay | Admitting: Family

## 2014-11-28 ENCOUNTER — Encounter (INDEPENDENT_AMBULATORY_CARE_PROVIDER_SITE_OTHER): Payer: Self-pay | Admitting: Family

## 2014-11-28 DIAGNOSIS — J4520 Mild intermittent asthma, uncomplicated: Secondary | ICD-10-CM

## 2014-11-28 MED ORDER — ALBUTEROL SULFATE HFA 108 (90 BASE) MCG/ACT IN AERS
INHALATION_SPRAY | RESPIRATORY_TRACT | Status: DC
Start: 2014-11-28 — End: 2015-05-11

## 2014-11-28 NOTE — Telephone Encounter (Signed)
Routing to [place] for approval.    Medication Refill Documentation  Name of Medication Albuterol Sulfate HFA (PROAIR HFA) 108 (90 BASE) MCG/ACT Inhalation Aero Soln  Prescribing Provider Fonnie Mu, ARNP  Date last filled 06/20/2014  Date last seen for this issue Unknown  Pharmacy loaded: yes

## 2014-12-26 ENCOUNTER — Ambulatory Visit (INDEPENDENT_AMBULATORY_CARE_PROVIDER_SITE_OTHER): Payer: BLUE CROSS/BLUE SHIELD | Admitting: Family

## 2014-12-26 ENCOUNTER — Encounter (INDEPENDENT_AMBULATORY_CARE_PROVIDER_SITE_OTHER): Payer: Self-pay | Admitting: Family

## 2014-12-26 ENCOUNTER — Telehealth (INDEPENDENT_AMBULATORY_CARE_PROVIDER_SITE_OTHER): Payer: Self-pay | Admitting: Family

## 2014-12-26 VITALS — BP 154/87 | HR 66 | Temp 97.8°F | Resp 16 | Ht 71.5 in | Wt 207.6 lb

## 2014-12-26 DIAGNOSIS — J4531 Mild persistent asthma with (acute) exacerbation: Secondary | ICD-10-CM

## 2014-12-26 DIAGNOSIS — I10 Essential (primary) hypertension: Secondary | ICD-10-CM

## 2014-12-26 DIAGNOSIS — B182 Chronic viral hepatitis C: Secondary | ICD-10-CM

## 2014-12-26 DIAGNOSIS — Z125 Encounter for screening for malignant neoplasm of prostate: Secondary | ICD-10-CM

## 2014-12-26 DIAGNOSIS — R5383 Other fatigue: Secondary | ICD-10-CM

## 2014-12-26 DIAGNOSIS — L989 Disorder of the skin and subcutaneous tissue, unspecified: Secondary | ICD-10-CM

## 2014-12-26 LAB — COMPREHENSIVE METABOLIC PANEL
ALT (GPT): 69 U/L — ABNORMAL HIGH (ref 10–48)
AST (GOT): 34 U/L (ref 9–38)
Albumin: 4.3 g/dL (ref 3.5–5.2)
Alkaline Phosphatase (Total): 47 U/L (ref 37–159)
Anion Gap: 4 (ref 4–12)
Bilirubin (Total): 0.3 mg/dL (ref 0.2–1.3)
Calcium: 9.4 mg/dL (ref 8.9–10.2)
Carbon Dioxide, Total: 31 meq/L (ref 22–32)
Chloride: 103 meq/L (ref 98–108)
Creatinine: 1.07 mg/dL (ref 0.51–1.18)
GFR, Calc, African American: >60 mL/min (ref >59)
GFR, Calc, European American: >60 mL/min (ref >59)
Glucose: 111 mg/dL (ref 62–125)
Potassium: 4 meq/L (ref 3.6–5.2)
Protein (Total): 7 g/dL (ref 6.0–8.2)
Sodium: 138 meq/L (ref 135–145)
Urea Nitrogen: 19 mg/dL (ref 8–21)

## 2014-12-26 LAB — CBC, DIFF
% Basophils: 1 %
% Eosinophils: 3 %
% Immature Granulocytes: 0 %
% Lymphocytes: 28 %
% Monocytes: 10 %
% Neutrophils: 58 %
% Nucleated RBC: 0 %
Absolute Eosinophil Count: 0.23 10*3/uL (ref 0.00–0.50)
Absolute Lymphocyte Count: 2.24 10*3/uL (ref 1.00–4.80)
Basophils: 0.06 10*3/uL (ref 0.00–0.20)
Hematocrit: 44 % (ref 38–50)
Hemoglobin: 14.8 g/dL (ref 13.0–18.0)
Immature Granulocytes: 0.02 10*3/uL (ref 0.00–0.05)
MCH: 32.2 pg (ref 27.3–33.6)
MCHC: 33.7 g/dL (ref 32.2–36.5)
MCV: 95 fL (ref 81–98)
Monocytes: 0.77 10*3/uL (ref 0.00–0.80)
Neutrophils: 4.82 10*3/uL (ref 1.80–7.00)
Nucleated RBC: 0 10*3/uL
Platelet Count: 253 10*3/uL (ref 150–400)
RBC: 4.6 10*6/uL (ref 4.40–5.60)
RDW-CV: 12.3 % (ref 11.6–14.4)
WBC: 8.14 10*3/uL (ref 4.3–10.0)

## 2014-12-26 LAB — PSA, DIAGNOSTIC/MONITORING: PSA, Diagnostic/Monitoring: 0.97 ng/mL (ref 0.00–4.00)

## 2014-12-26 MED ORDER — MONTELUKAST SODIUM 10 MG OR TABS
10.0000 mg | ORAL_TABLET | Freq: Every evening | ORAL | Status: DC
Start: 2014-12-26 — End: 2015-11-28

## 2014-12-26 NOTE — Progress Notes (Signed)
Subjective:  Ronald Page is a 54 year old Male here with chief complaint(s):     1) he's concerned about his hepatitis C and would like blood work drawn.  He has a long-standing history of hepatitis C but has not been approved for treatment.  He is tired in the afternoon after 2:00 and is concerned that it is getting worse.  2) fatigue in the afternoon-he notes that he has to use his inhaler between 12 and 1:00 every day.  It is a new office.  He denies cough but does feel tight in the chest.  Has not thought about his asthma for a while as being a possible cause.  Sleeping fairly well.  Not on any maintenance medication.    3.  He is requesting a referral to dermatology for several skin lesions of concern.  He did have one on his scalp in 2014 and he like to have a comprehensive evaluation.  He is noted at least to that appear to be changing along his face.  Past Medical History   Diagnosis Date   . Allergic rhinitis, cause unspecified    . Chronic obstructive asthma, unspecified    . Other abnormal heart sounds    . Hepatitis, unspecified      No past surgical history on file.  family history includes Cancer in his maternal grandfather; Diabetes in his maternal grandfather; Heart Disease in his father.  No past surgical history on file.    Patient Active Problem List   Diagnosis   . Bradycardia   . Fatigue   . Hepatitis C   . HTN (hypertension)   . Asthma   . Hearing loss     Current Outpatient Prescriptions   Medication Sig Dispense Refill   . Albuterol Sulfate HFA (PROAIR HFA) 108 (90 BASE) MCG/ACT Inhalation Aero Soln inhale 2 puffs by mouth every 4 hours if needed and 15 MINUTES prior to exercise for asthma 8.5 g 2   . Aspirin 81 MG Oral Tab Take 81 mg by mouth daily.     . Cyclobenzaprine HCl (FLEXERIL) 10 MG Oral Tab Take 1 tablet (10 mg) by mouth at bedtime as needed for muscle spasms. For muscle pain. 60 tablet 0   . Diclofenac Sodium 1 % Transdermal Gel Apply 2 g to affected area on neck 4 times a  day as needed (pain). 3 Tube 0   . Diclofenac Sodium 75 MG Oral Tab EC Take 1 tablet (75 mg) by mouth 2 times a day. 60 tablet 1   . Glucosamine 500 MG Oral Cap Take 500 mg by mouth 2 times a day.     . OxyCODONE HCl 5 MG Oral Tab Take 1 tablet (5 mg) by mouth every 4 hours as needed for pain. 15 tablet 0     No current facility-administered medications for this visit.     Social History     Social History   . Marital Status: Married     Spouse Name: N/A   . Number of Children: N/A   . Years of Education: N/A     Occupational History   . Not on file.     Social History Main Topics   . Smoking status: Former Games developer   . Smokeless tobacco: Never Used   . Alcohol Use: Yes      Comment: occ   . Drug Use: No   . Sexual Activity: Not on file     Other Topics Concern   .  Not on file     Social History Narrative    Work out 3 x per week    Both cardio and strength training          Allergies-Review of patient's allergies indicates no known allergies.  ROS: Review of Systems:  Constitutional: Sleeping well.  Continues to work out.  No night sweats.   Eyes: No blurred vision   Ears, Nose, Mouth, Throat: Occasional congestion in nasal passages.  Mild itchy throat.  Mild dry cough.   Cardiovascular: No chest pain with exercise no palpitations.  No heaviness in chest.   Respiratory: See above history of present illness   Gastrointestinal: Denies gastric reflux symptoms.  No abdominal pain.  No constipation or diarrhea     Musculoskeletal: Neck continues to be stiff and sore at times   Psychiatric: Denies anxiety or depression    OBJECTIVE:    Physical:   Pleasant male with appropriate height and weight.  Vital signs are noted for elevation of his systolic blood pressure.    HEENT: He only has a small amount of clear sinus drainage.  Sinuses are nontender.  Tympanic membranes are clear.  Throat is clear.  Chest: Lungs are clear without wheezing or rhonchi.  Auscultated breath sounds anteriorly posteriorly in all lobes.  Neck:  Regular rate and rhythm.  S1 and S2 are heard without murmur.  Skin: He does have multiple moles varying shapes and sizes.  Abdomen: There is no distention or hernias noted.  There are no masses.  Palpation is negative for tenderness or masses.  Bowel sounds are equal in all quadrants.  No lymph node enlargement.  Psych: Appears to have a normal mood      IMPRESSION:    ((B18.2) Chronic hepatitis C without hepatic coma (HCC)  (primary encounter diagnosis)  Plan: HEPATITIS C RNA, QUANT  Repeat and consider CT scan if this is elevated.    (I10) Essential hypertension  Plan: COMPREHENSIVE METABOLIC PANEL  Discussed the need to ensure he follows cardiac friendly diet.  Encouraged him to drop 10 pounds.  Make sure he practices appropriate breathing technique and keeps his lungs open to reduce his hypertension.  Recheck in 4-6 weeks and take his blood pressure at home.    (R53.83) Fatigue, unspecified type  Plan: CBC, DIFF  Rule out the obvious change in complete blood count that might be related to fatigue.  He agreed to use his inhaler after lunch and note whether he is improving with his breathing.  We also added montelukast nightly.  He will let me know if he is improving and if not further testing will be ordered.    (Z12.5) Screening PSA (prostate specific antigen)  Plan: PROSTATE SPECIFIC ANTIGEN      (L98.9) Skin lesion  Plan: REFERRAL TO DERMATOLOGY      (J45.31) Mild persistent asthma with acute exacerbation  Plan: Montelukast Sodium 10 MG Oral Tab          Orders per documented in this encounter  Patient instructions per documented in this encounter          Patient Instructions   We're Moving!!  As of January 2017 our new address will be [address] in [place].      It was a pleasure to see you in clinic today.                If you are not yet signed up for eCare, please see the below eCare  section at the end of this document for how to enroll and to get your access codes.  It's easy to sign up at  home today.    eCare  enrollment will allow you access to the below benefits   You can make appointments online   View test results / Lab Results   Request prescription renewals   Obtain a copy of our After Visit Summary (an electronic copy of this document)     Your Test Results:  If labs were ordered today the results are expected to be available via eCare in about 5 days. If you have an active eCare account, this is how we will notify you of your results.     If you do not have an eCare account then your test results will be mailed to you within about 14 days after your tests are completed. If your physician needs to change your care based on your results or is concerned, you should expect a phone call or eCare message from your provider.    If you have any questions about your test results please schedule an appointment with your provider, so that we may review them with you in greater detail.    **If it has been more than 2 weeks and you have not received your test results please send our office a message via eCare.    Medication Refills: If you need a prescription refilled, please contact your pharmacy 1 week before your current supply will run out to request the refill.  Contacting your pharmacy is the fastest and safest way to obtain a medication refill.  The pharmacy will notify our office.  Please note, that a minimum of 48 to 72 hours is needed to refill a medication,  Please call your pharmacy early to allow enough time to refill before you anticipate running out.  For faster medication refills, you can also schedule an appointment with your provider.    We know you have a choice in where you receive your healthcare and we sincerely thank you for trusting [facility] with your health.

## 2014-12-26 NOTE — Progress Notes (Signed)
Reason for visit:  Annual physical       Colon Screen:  Done [facility]/11/2014 at [facility] in [place] - pt brought in colonscopy report     Have you seen a specialist since your last visit: NO      Name and location and date. N/a      HM Due:   Health Maintenance   Topic Date Due   . Colon Cancer Screen w/ FOBT/FIT  03/24/2010   . Influenza Vaccine (1) 12/15/2014   . Diabetes Screen  07/09/2016   . Cholesterol Test  02/13/2018   . Tetanus Vaccine  07/21/2023   . HIV Screen  Addressed              No Future Appointments

## 2014-12-26 NOTE — Patient Instructions (Addendum)
We're Moving!!  As of January 2017 our new address will be [address] in [place].      It was a pleasure to see you in clinic today.          Hepatitis C follow up and medication if necessary. CT liver pending       If you are not yet signed up for eCare, please see the below eCare section at the end of this document for how to enroll and to get your access codes.  It's easy to sign up at home today.    eCare  enrollment will allow you access to the below benefits   You can make appointments online   View test results / Lab Results   Request prescription renewals   Obtain a copy of our After Visit Summary (an electronic copy of this document)     Your Test Results:  If labs were ordered today the results are expected to be available via eCare in about 5 days. If you have an active eCare account, this is how we will notify you of your results.     If you do not have an eCare account then your test results will be mailed to you within about 14 days after your tests are completed. If your physician needs to change your care based on your results or is concerned, you should expect a phone call or eCare message from your provider.    If you have any questions about your test results please schedule an appointment with your provider, so that we may review them with you in greater detail.    **If it has been more than 2 weeks and you have not received your test results please send our office a message via eCare.    Medication Refills: If you need a prescription refilled, please contact your pharmacy 1 week before your current supply will run out to request the refill.  Contacting your pharmacy is the fastest and safest way to obtain a medication refill.  The pharmacy will notify our office.  Please note, that a minimum of 48 to 72 hours is needed to refill a medication,  Please call your pharmacy early to allow enough time to refill before you anticipate running out.  For faster medication refills, you can  also schedule an appointment with your provider.    We know you have a choice in where you receive your healthcare and we sincerely thank you for trusting [facility] with your health.

## 2014-12-26 NOTE — Telephone Encounter (Signed)
Patient has [employer] insurance and [facility] is not in network   Referral needing redirection and trying to contact patient as to where he would like to be seen.     CCR-If patient returns call, please warm transfer to [id number] to assist with referral, if no answer, please take message and route to [facility].   Thank you

## 2014-12-28 ENCOUNTER — Encounter (INDEPENDENT_AMBULATORY_CARE_PROVIDER_SITE_OTHER): Payer: Self-pay

## 2014-12-28 NOTE — Telephone Encounter (Signed)
Referral redirected to [facility], patient notified in voicemail

## 2014-12-30 ENCOUNTER — Telehealth (INDEPENDENT_AMBULATORY_CARE_PROVIDER_SITE_OTHER): Payer: Self-pay | Admitting: Family

## 2014-12-30 DIAGNOSIS — R768 Other specified abnormal immunological findings in serum: Secondary | ICD-10-CM

## 2014-12-30 LAB — HEPATITIS C RNA, QUANT
Hcv RNA Iu/Ml (Log 10): 5.1 {Log_IU}/mL
Hepatitis C Quant Result: 115642 [IU]/mL — AB

## 2014-12-30 NOTE — Telephone Encounter (Signed)
Left message for regarding his lab results    [phone]  Ronald Page as well

## 2015-01-03 ENCOUNTER — Encounter (INDEPENDENT_AMBULATORY_CARE_PROVIDER_SITE_OTHER): Payer: Self-pay | Admitting: Family

## 2015-01-03 NOTE — Telephone Encounter (Signed)
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.

## 2015-05-11 ENCOUNTER — Other Ambulatory Visit (INDEPENDENT_AMBULATORY_CARE_PROVIDER_SITE_OTHER): Payer: Self-pay | Admitting: Family

## 2015-05-11 DIAGNOSIS — J4520 Mild intermittent asthma, uncomplicated: Secondary | ICD-10-CM

## 2015-05-11 MED ORDER — PROAIR HFA 108 (90 BASE) MCG/ACT IN AERS
INHALATION_SPRAY | RESPIRATORY_TRACT | Status: DC
Start: 2015-05-11 — End: 2015-09-07

## 2015-05-17 ENCOUNTER — Encounter (INDEPENDENT_AMBULATORY_CARE_PROVIDER_SITE_OTHER): Payer: Self-pay | Admitting: Family

## 2015-05-17 DIAGNOSIS — L989 Disorder of the skin and subcutaneous tissue, unspecified: Secondary | ICD-10-CM

## 2015-05-17 NOTE — Telephone Encounter (Signed)
Routing to Referral Pool for assistance.

## 2015-05-17 NOTE — Telephone Encounter (Signed)
[facility],  Patient has [employer] insurance     Please complete and sign if approved     Thank you

## 2015-05-26 ENCOUNTER — Ambulatory Visit (HOSPITAL_BASED_OUTPATIENT_CLINIC_OR_DEPARTMENT_OTHER): Payer: No Typology Code available for payment source | Attending: Infectious Disease | Admitting: Infectious Disease

## 2015-05-26 ENCOUNTER — Encounter (HOSPITAL_BASED_OUTPATIENT_CLINIC_OR_DEPARTMENT_OTHER): Payer: Self-pay | Admitting: Infectious Disease

## 2015-05-26 VITALS — BP 160/100 | HR 65 | Temp 97.3°F | Resp 17 | Ht 72.0 in | Wt 209.9 lb

## 2015-05-26 DIAGNOSIS — B182 Chronic viral hepatitis C: Secondary | ICD-10-CM | POA: Insufficient documentation

## 2015-05-26 NOTE — Patient Instructions (Signed)
We discussed with you today trying sofosbuvir + ledipasvir (Harvoni), a one pill daily antiviral for HCV GT 1 for 8 wk course that has >95% chance of virologic cure.  Side effects are minimal, include fatigue, headaches and nausea.  Drug-drug interactions are [facility]'s Wort, rifampin, tipranavir, carbemezine and phenytoin and patient was counseled to not take these medications while on Harvoni.  In addition, Harvoni requires an acidic environment for absorption so antacids or acid suppressing agents should be taken at least 12 hrs apart from Harvoni.  Medications are also very expensive so prior authorization and approval process might take a while, like 1 months.  You are referred to pharmacy.  We will check HCV RNA and LFTs at 4 wks.

## 2015-05-26 NOTE — Progress Notes (Signed)
Initial Hepatitis and Liver Clinic Note    ID/CC:  Ronald Page is a 55 year old yo male seen at the request of Fonnie Mu, ARNP for further evaluation and possible treatment of chronic hepatitis C.    HPI:  Ronald Page was diagnosed in 1991 at time of routine blood donation, told he had hep C.  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  Dr. Janalyn Shy last notes of 12/28/2012 reviewed.  Denied coverage of Harvoni b/c early fibrosis.  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   Occupational exposure no    Sexual exposure no    Also had tattoos in 1980's in [place]    Allergies:  Review of patient's allergies indicates:  No Known Allergies confirmed    PMH:  Past Medical History   Diagnosis Date   . Allergic rhinitis, cause unspecified    . Chronic obstructive asthma, unspecified (HCC)    . Other abnormal heart sounds    . Hepatitis, unspecified     R shoulder pain  HTN    PSH:  No past surgical history on file. s/p appy in 1992, R shoulder arthroscopy    Medications:  Outpatient Prescriptions Prior to Visit   Medication Sig Dispense Refill   . Aspirin 81 MG Oral Tab Take 81 mg by mouth daily.     . Cyclobenzaprine HCl (FLEXERIL) 10 MG Oral Tab Take 1 tablet (10 mg) by mouth at bedtime as needed for muscle spasms. For muscle pain. 60 tablet 0   . Diclofenac Sodium 1 % Transdermal Gel Apply 2 g to affected area on neck 4 times a day as needed (pain). 3 Tube 0   . Diclofenac Sodium 75 MG Oral Tab EC Take 1 tablet (75 mg) by mouth 2 times a day. 60 tablet 1   . Glucosamine 500 MG Oral Cap Take 500 mg by mouth 2 times a day.     . Montelukast Sodium 10 MG Oral Tab Take 1 tablet (10 mg) by mouth every  evening. 90 tablet 2   . OxyCODONE HCl 5 MG Oral Tab Take 1 tablet (5 mg) by mouth every 4 hours as needed for pain. 15 tablet 0   . PROAIR HFA 108 (90 BASE) MCG/ACT Inhalation Aero Soln inhale 2 puffs by mouth every 4 hours if needed and 15 MINUTES PRIOR TO EXERCISE for asthma 1 Inhaler 2     No facility-administered medications prior to visit.    confirmed, not taking oxycodone or glucosamine    SH/FH:  Social History     Social History   . Marital Status: Married     Spouse Name: N/A   . Number of Children: N/A   . Years of Education: N/A     Occupational History   . Not on file.     Social History Main Topics   . Smoking status: Former Games developer   . Smokeless tobacco: Never Used   . Alcohol Use: Yes      Comment: occ   . Drug Use: No   .  Sexual Activity: Not on file     Other Topics Concern   . Not on file     Social History Narrative    Work out 3 x per week    Both cardio and strength training     Rare glass of wine  2 kids, wife is a TEFL teacher to hike and [employer] vintage guns     FH grandfather had early colon CA, no liver dz or cirrhosis in family    ROS:   Constitutional: Positive for fatigue   Eyes: Negative    Ears, Nose, Mouth, Throat: +tinnitus, 40% hearing loss in L ear   Cardiovascular: Negative    Respiratory: Negative    Gastrointestinal: Negative   Genitourinary: Negative   Musculoskeletal: Positive for knee pain.   Skin: Negative    Neurological: Negative    Psychiatric: Negative    Endocrine: Negative    Hematologic/Lymphatic: Negative   Allergic/Immunologic: Negative     PE:  BP 160/100 mmHg  Pulse 65  Temp(Src) 97.3 F (36.3 C) (Temporal)  Resp 17  Ht 6' (1.829 m)  Wt 209 lb 14.1 oz (95.2 kg)  BMI 28.46 kg/m2  CONSTITUTIONAL/GENERAL:  Appearance:  Well developed, appearing stated age and in no acute distress. Weight appears normal for height.  HENT AT/NC, alopecia,  Facial features:  External ears and nose are normal in appearance without significant scars or asymmetry. TM shiny  bilaterally, OP clear w/o lesions  EYES:  Lids and Conjunctiva Conjunctiva and lids are normal.,  Pupils: Puplis equal, round and reactive to light.    CVS exam: S1, S2 normal, no murmur, click, rub or gallop, regular rate and rhythm. No edema.  RESPIRATORY:  Inspection:  normal respiratory effort and chest wall movement with respiration, Palpation:  Normal., Percussion:  No areas of dullness or hyper-resonance.  There is normal diaphragmatic movement with respiration., Auscultation:  Clear lung fields without crackles, wheezing or expiratory prolongation.  ABDOMINAL/GI:  Abdominal exam:  normal in appearance with normal bowel sounds and no masses or tenderness.   Liver and spleen:  no hepatosplenomegaly.  Abd Wall Hernias:  no hernias present.  LYMPHATIC:  Cervical nodes:  no adenopathy, Axillary nodes:  No adenopathy., Inguinal nodes:  No adenopathy.Marland Kitchen  SKIN EXAM: Negative for rashes suspicious nevi or other abnormalities. 3 mm macule well circumbscribed on R cheek  NEURO: asterexis no tremors no  MSK Gait and station:  Normal. No hot joints.  MENTAL STATUS: PSYCHIATRIC:  Judgement/insight:  Normal, Mood/affect:  Normal., Orientation:  Normal., Recent/remote memory:  Normal..    Labs:  Office Visit on 12/26/2014   Component Date Value   . Hepatitis C Quant Result 12/26/2014 [id number]*   . Hcv RNA Iu/Ml (Log 10) 12/26/2014 5.1    . Hepatitis C Quant Interp 12/26/2014                      Value:The limit of quantitative detection (the minimum virus level that gives a positive result in 95% of replicates) is 12 IU/mL (1.08 log IU/mL). Quantitative results less than 12 IU/mL are described as   very low positive.  The clinical significance of very low positive results is uncertain and should be individualized.     . Sodium 12/26/2014 138    . Potassium 12/26/2014 4.0    . Chloride 12/26/2014 103    . Carbon Dioxide, Total 12/26/2014 31    . Anion Gap 12/26/2014 4    . Glucose  12/26/2014 111    . Urea Nitrogen 12/26/2014 19     . Creatinine 12/26/2014 1.07    . Protein (Total) 12/26/2014 7.0    . Albumin 12/26/2014 4.3    . Bilirubin (Total) 12/26/2014 0.3    . Calcium 12/26/2014 9.4    . AST (GOT) 12/26/2014 34    . Alkaline Phosphatase (To* 12/26/2014 47    . ALT (GPT) 12/26/2014 69*   . GFR, Calc, European Amer* 12/26/2014 >60    . GFR, Calc, African Ameri* 12/26/2014 >60    . GFR, Information 12/26/2014 Calculated GFR in mL/min/1.73 m2 by MDRD equation.  Inaccurate with changing renal function.  See [email or web address]    . WBC 12/26/2014 8.14    . RBC 12/26/2014 4.60    . Hemoglobin 12/26/2014 14.8    . Hematocrit 12/26/2014 44    . MCV 12/26/2014 95    . [facility] 12/26/2014 32.2    . MCHC 12/26/2014 33.7    . Platelet Count 12/26/2014 253    . RDW-CV 12/26/2014 12.3    . % Neutrophils 12/26/2014 58    . % Lymphocytes 12/26/2014 28    . % Monocytes 12/26/2014 10    . % Eosinophils 12/26/2014 3    . % Basophils 12/26/2014 1    . % Immature Granulocytes 12/26/2014 0    . Neutrophils 12/26/2014 4.82    . Absolute Lymphocyte Count 12/26/2014 2.24    . Monocytes 12/26/2014 0.77    . Absolute Eosinophil Count 12/26/2014 0.23    . Basophils 12/26/2014 0.06    . Immature Granulocytes 12/26/2014 0.02    . Nucleated RBC 12/26/2014 0.00    . % Nucleated RBC 12/26/2014 0    . Prostate Specific Antigen 12/26/2014 0.97        Studies:  Liver Bx 2009: stage 0, grade 1  U/s 2009: Impression: No focal mass on ultrasound. Biliary sludge. Marked for   biopsy.     Impression/Plan:  1) Chronic Hepatitis C.  Ronald Page is a 55 year old white man w/ chronic hep C, GT 1a, low viral load and early fibrosis in 2009 who most likely acquired HCV 25 yrs ago from either INC or tattoos.  He has good synthetic function with a recent low APRI (0.354).  I got some labs to update his liver functioning, including FibroSure, CMP and also got Hep A/B serologies.  He most likely still has early fibrosis.  However, according to  AASLD/IDSA guidelines, patients w/ life expectancies >1 yr should all be considered for therapy, regardless of fibrosis level.  I discussed with the patient today trying sofosbuvir + ledipasvir (Harvoni), a one pill daily antiviral for HCV GT 1 for 8 wk course that has >95% chance of virologic cure.  Side effects are minimal, include fatigue, headaches and nausea.  Drug-drug interactions are Ronald Page's Wort, rifampin, tipranavir, carbemezine and phenytoin and patient was counseled to not take these medications while on Harvoni.  In addition, Harvoni requires an acidic environment for absorption so antacids or acid suppressing agents should be taken at least 12 hrs apart from Harvoni.  Medications are also very expensive so prior authorization and approval process might take a while, like 1-2 months.  We have referred patient to pharmacy.  We will check HCV RNA and LFTs at 4 wks.    Return to clinic in 8-12 weeks.

## 2015-05-30 ENCOUNTER — Encounter (HOSPITAL_BASED_OUTPATIENT_CLINIC_OR_DEPARTMENT_OTHER): Payer: Self-pay

## 2015-05-30 NOTE — Progress Notes (Signed)
Ronald Page has been enrolled in [facility].      Pharmacy benefits investigation is underway.

## 2015-06-09 ENCOUNTER — Encounter (HOSPITAL_BASED_OUTPATIENT_CLINIC_OR_DEPARTMENT_OTHER): Payer: Self-pay | Admitting: Physician Assistant

## 2015-06-09 ENCOUNTER — Ambulatory Visit: Payer: No Typology Code available for payment source | Attending: Physician Assistant | Admitting: Physician Assistant

## 2015-06-09 DIAGNOSIS — L821 Other seborrheic keratosis: Secondary | ICD-10-CM | POA: Insufficient documentation

## 2015-06-09 DIAGNOSIS — L814 Other melanin hyperpigmentation: Secondary | ICD-10-CM | POA: Insufficient documentation

## 2015-06-09 NOTE — Progress Notes (Signed)
Do you have a history of skin cancer? NO    Personal history of melanoma? NO    Immediate family history of melanoma? NO    Would you like a full skin exam today? NO    Gown: NO      If our office needs to contact you after your visit today, is it ok to leave a detailed message on your phone? YES    What is the preferred number?   Telephone Information:   Home Phone [phone]

## 2015-06-09 NOTE — Progress Notes (Signed)
Dermatology Outpatient Visit Note  06/09/2015    Identification / Chief Complaint  Ronald Page is a 55 year old male seen at the request of Eliane Decree for consultative opinion regarding lesion of concern on face.    History of Present Illness  Ronald Page is seen for lesion of concern on face.  He is new to the Dermatology clinic.     Personal Skin Cancer History: NO  Family history of skin cancer NO    Reports that he started shaving his head in his mid 30s, and since then has been diligent about wearing a wide-brimmed hat.  He did tan with baby oil occasionally though as a teenager.    Has a brown spot on his left cheek, which he would like evaluated.   Seems to be enlarging in size, but otherwise asymptomatic.     Skin cancer risk factors:  History of serious sunburn: NO  History of excessive sun exposure: NO  History of tanning bed use: NO  Uses SPF regularly: YES  Uses hats regularly in the sun: YES    Grew up in [place].      Past medical History  Patient Active Problem List   Diagnosis   . Bradycardia   . Fatigue   . Hepatitis C   . HTN (hypertension)   . Asthma   . 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  Son going to college in [place].    Review of Systems  Denies fevers and unintentional weight loss.    Allergies  Review of patient's allergies indicates:  No Known Allergies    Medications   Outpatient Prescriptions Marked as Taking for the 06/09/15 encounter (Office Visit) with Arna Snipe, PA-C   Medication Sig Dispense Refill   . Aspirin 81 MG Oral Tab Take 81 mg by mouth daily.     . Glucosamine 500 MG Oral Cap Take 500 mg by mouth 2 times a day.     . Montelukast Sodium 10 MG Oral Tab Take 1 tablet (10 mg) by mouth every evening. 90 tablet 2   . PROAIR HFA 108 (90 BASE) MCG/ACT Inhalation Aero Soln inhale 2 puffs by mouth every 4 hours if needed and 15 MINUTES PRIOR TO EXERCISE for asthma 1 Inhaler 2          Physical  Exam  Vital signs:  There were no vitals taken for this visit.  Gen: Well developed, well nourished, in no acute distress  Psychiatric:  Normal Mood and Affect  Neurologic:  Alert with no apparent barriers to understanding treatment recommendations  HEENT:  Lips are unremarkable  Eyes:  Eyes are not injected  Musculoskeletal:  Digits and Nails are without cyanosis clubbing or edema  Skin:  The areas examined included the scalp/hair, head/face, eyelids, neck, and bilateral hands.  A full skin exam was offered today, but declined by patient.  Lesion of concern is a brown stuck-on, warty-appearing lesion on the right pre-auricular cheek.  A few scattered tan macules noted on scalp and hands.   Faint telangiectasia are noted across the face.  Remaining cutaneous surfaces examined were unremarkable.    Assessment and Plan:  1. Seborrheic keratoses  Reassurance given regarding the benign nature of these lesions.  Educational brochure given and reviewed.    2. Lentigines  Patient will continue his daily sun protection and use of hat.  We reviewed signs and symptoms of actinic keratoses and non-melanoma skin cancer, including non-healing sores or bumps, and areas that easily bleed with minor trauma.      Patient will follow-up in clinic as needed.    Overton Mam, PA-C, Ph.D.  [place] [facility]

## 2015-06-15 ENCOUNTER — Other Ambulatory Visit: Payer: Self-pay

## 2015-06-30 ENCOUNTER — Encounter (INDEPENDENT_AMBULATORY_CARE_PROVIDER_SITE_OTHER): Payer: Self-pay | Admitting: Family

## 2015-06-30 NOTE — Telephone Encounter (Signed)
Ronald Page please see ecare message.

## 2015-07-03 NOTE — Telephone Encounter (Signed)
My Request is:    Could you contact this specialty pharmacist and found out the status of [name]'s prior authorization for his Hepatitis C treatment.    Editor: Teodoro Kilstapenko, Viktoriya O, Tech (Technician)            Expand All Collapse All    Ronald LevyBrian Gordon Page has been [facility].               Preferred Plan:    I can call if someone could at least find her phone number. I prefer if someone could ask her directly.  This is for treatment of Hepatitis C.        Actions taken by me on this concern to date:    I let the patient know we were researching the status.       Pertinent information:    He has Hepatitis C and starting therapy      Thank you!    Ronald DimmerKerry

## 2015-07-04 NOTE — Telephone Encounter (Signed)
[place].

## 2015-07-04 NOTE — Telephone Encounter (Addendum)
Spoke to [facility] [phone] and PA has been submitted,   She will follow up with the pharmacy,  Routing this message from patient to their office.

## 2015-07-10 ENCOUNTER — Encounter (HOSPITAL_BASED_OUTPATIENT_CLINIC_OR_DEPARTMENT_OTHER): Payer: Self-pay | Admitting: Infectious Disease

## 2015-08-07 ENCOUNTER — Encounter (HOSPITAL_BASED_OUTPATIENT_CLINIC_OR_DEPARTMENT_OTHER): Payer: Self-pay | Admitting: Infectious Disease

## 2015-08-09 ENCOUNTER — Encounter (HOSPITAL_BASED_OUTPATIENT_CLINIC_OR_DEPARTMENT_OTHER): Payer: Self-pay | Admitting: Infectious Disease

## 2015-08-11 ENCOUNTER — Encounter (HOSPITAL_BASED_OUTPATIENT_CLINIC_OR_DEPARTMENT_OTHER): Payer: No Typology Code available for payment source | Admitting: Infectious Disease

## 2015-08-11 NOTE — Progress Notes (Signed)
[name] did not cancel and was not present for a scheduled appointment today.  Disposition: Chart reviewed, patient contacted by [facility] regarding follow-up.  He had his Rx for Harvoni denied by insurance company b/c they prefer [employer] + RBV.  I'm OK w/ that substitution, pt not anemic.  Staff working on getting that Rx and will r/s pt for a 4 wk f/u me upon starting antivirals.

## 2015-08-14 ENCOUNTER — Encounter (HOSPITAL_BASED_OUTPATIENT_CLINIC_OR_DEPARTMENT_OTHER): Payer: Self-pay | Admitting: Infectious Disease

## 2015-08-21 ENCOUNTER — Encounter (HOSPITAL_BASED_OUTPATIENT_CLINIC_OR_DEPARTMENT_OTHER): Payer: Self-pay | Admitting: Infectious Disease

## 2015-08-25 ENCOUNTER — Other Ambulatory Visit (HOSPITAL_BASED_OUTPATIENT_CLINIC_OR_DEPARTMENT_OTHER): Payer: Self-pay | Admitting: Infectious Disease

## 2015-08-25 DIAGNOSIS — B182 Chronic viral hepatitis C: Secondary | ICD-10-CM

## 2015-09-01 ENCOUNTER — Other Ambulatory Visit (INDEPENDENT_AMBULATORY_CARE_PROVIDER_SITE_OTHER): Payer: No Typology Code available for payment source

## 2015-09-01 ENCOUNTER — Ambulatory Visit (INDEPENDENT_AMBULATORY_CARE_PROVIDER_SITE_OTHER): Payer: Self-pay | Admitting: Family Medicine

## 2015-09-01 ENCOUNTER — Other Ambulatory Visit (HOSPITAL_BASED_OUTPATIENT_CLINIC_OR_DEPARTMENT_OTHER)
Admit: 2015-09-01 | Discharge: 2015-09-01 | Disposition: A | Discharge status: Home / Self Care | Payer: Self-pay | Attending: Infectious Disease | Admitting: Infectious Disease

## 2015-09-01 DIAGNOSIS — B182 Chronic viral hepatitis C: Secondary | ICD-10-CM

## 2015-09-01 NOTE — Progress Notes (Signed)
Courtesy blood draw for [facility]  [facility]SCOTT [facility].    Tests drawn:[id number]

## 2015-09-07 ENCOUNTER — Other Ambulatory Visit (INDEPENDENT_AMBULATORY_CARE_PROVIDER_SITE_OTHER): Payer: Self-pay | Admitting: Family

## 2015-09-07 DIAGNOSIS — J4520 Mild intermittent asthma, uncomplicated: Secondary | ICD-10-CM

## 2015-09-07 MED ORDER — ALBUTEROL SULFATE HFA 108 (90 BASE) MCG/ACT IN AERS
2.0000 | INHALATION_SPRAY | RESPIRATORY_TRACT | 2 refills | Status: DC | PRN
Start: 2015-09-07 — End: 2016-02-22

## 2015-09-07 NOTE — Telephone Encounter (Signed)
Routing to [facility] to assist.

## 2015-09-07 NOTE — Telephone Encounter (Signed)
From: Denny LevyBrian Gordon Fancher  Sent: 09/07/2015 5:21 AM PDT  Subject: Medication Renewal Request    Richrd PrimeBrian G. Slayden would like a refill of the following medications:  PROAIR HFA 108 (90 BASE) MCG/ACT Inhalation Aero Soln Babette Relic[[name] E Meyer, ARNP]    [facility] [phone] [phone] [address] [place] [phone]     Comment:

## 2015-09-12 ENCOUNTER — Telehealth (INDEPENDENT_AMBULATORY_CARE_PROVIDER_SITE_OTHER): Payer: Self-pay | Admitting: Family

## 2015-09-12 NOTE — Telephone Encounter (Signed)
FYI Left pt voicemail to let him know lab orders must be in the system before we can draw his blood on 6/5

## 2015-09-14 ENCOUNTER — Other Ambulatory Visit (HOSPITAL_BASED_OUTPATIENT_CLINIC_OR_DEPARTMENT_OTHER): Payer: Self-pay | Admitting: Pharmacist

## 2015-09-14 DIAGNOSIS — B182 Chronic viral hepatitis C: Secondary | ICD-10-CM

## 2015-09-18 ENCOUNTER — Other Ambulatory Visit (HOSPITAL_BASED_OUTPATIENT_CLINIC_OR_DEPARTMENT_OTHER)
Admit: 2015-09-18 | Discharge: 2015-09-18 | Disposition: A | Discharge status: Home / Self Care | Payer: No Typology Code available for payment source | Attending: Infectious Disease | Admitting: Infectious Disease

## 2015-09-18 ENCOUNTER — Other Ambulatory Visit (INDEPENDENT_AMBULATORY_CARE_PROVIDER_SITE_OTHER): Payer: No Typology Code available for payment source

## 2015-09-18 DIAGNOSIS — B182 Chronic viral hepatitis C: Secondary | ICD-10-CM

## 2015-09-18 NOTE — Progress Notes (Signed)
Courtesy blood draw for Dr[facility] Lorin PicketSCOTT at [facility].    Tests drawn: [email or web address]

## 2015-09-25 ENCOUNTER — Encounter (HOSPITAL_BASED_OUTPATIENT_CLINIC_OR_DEPARTMENT_OTHER): Payer: Self-pay | Admitting: Infectious Disease

## 2015-11-23 ENCOUNTER — Encounter (HOSPITAL_BASED_OUTPATIENT_CLINIC_OR_DEPARTMENT_OTHER): Payer: Self-pay | Admitting: Infectious Disease

## 2015-11-28 ENCOUNTER — Other Ambulatory Visit: Payer: Self-pay | Admitting: Family

## 2015-11-28 DIAGNOSIS — J4531 Mild persistent asthma with (acute) exacerbation: Secondary | ICD-10-CM

## 2015-11-28 NOTE — Telephone Encounter (Signed)
[facility] is requesting for  Montelukast 10mg    Last fill date not noted

## 2015-11-29 MED ORDER — MONTELUKAST SODIUM 10 MG OR TABS
10.0000 mg | ORAL_TABLET | Freq: Every evening | ORAL | 0 refills | Status: DC
Start: 2015-11-29 — End: 2016-02-02

## 2015-11-29 NOTE — Telephone Encounter (Signed)
Last visit 12/26/14. Please schedule appt, kindly close this encounter once pt is informed or scheduled.

## 2015-11-29 NOTE — Telephone Encounter (Signed)
Spoke with patient and he stated that he is on vacation and will call back to schedule when he returns.

## 2015-12-21 ENCOUNTER — Other Ambulatory Visit (INDEPENDENT_AMBULATORY_CARE_PROVIDER_SITE_OTHER): Payer: Self-pay | Admitting: Family

## 2015-12-21 DIAGNOSIS — J4531 Mild persistent asthma with (acute) exacerbation: Secondary | ICD-10-CM

## 2015-12-22 MED ORDER — MONTELUKAST SODIUM 10 MG OR TABS
ORAL_TABLET | ORAL | 0 refills | Status: DC
Start: 2015-12-22 — End: 2016-02-12

## 2016-01-02 ENCOUNTER — Encounter (HOSPITAL_BASED_OUTPATIENT_CLINIC_OR_DEPARTMENT_OTHER): Payer: Self-pay

## 2016-01-03 ENCOUNTER — Ambulatory Visit (HOSPITAL_BASED_OUTPATIENT_CLINIC_OR_DEPARTMENT_OTHER): Payer: Self-pay | Admitting: Pharmacist

## 2016-01-03 DIAGNOSIS — B182 Chronic viral hepatitis C: Secondary | ICD-10-CM

## 2016-01-03 MED ORDER — GLECAPREVIR-PIBRENTASVIR 100-40 MG OR TABS
3.0000 | ORAL_TABLET | Freq: Every day | ORAL | 0 refills | Status: DC
Start: 2016-01-03 — End: 2016-01-05

## 2016-01-03 NOTE — Progress Notes (Signed)
HCV Treatment Reviewed by Ronald Page. Ronald Page, PharmD, BCACP  Pt has been approved for treatment with Ronald Page by PAID/[employer] for 8 weeks.    Pt has a new medication start visit scheduled with the clinical pharmacist on 9/22.   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 Ronald Page daily X 8 weeks is appropriate.     Indication Appropriate:  yes  Medication Optimized:  yes         Patient Active Problem List   Diagnosis   . Bradycardia   . Fatigue   . Hepatitis C   . HTN (hypertension)   . Asthma   . Hearing loss       HAV status: Unknown - Negative 2008   HBV status: Unknown - Negative 2008   HIV status: Unknown - Ask patient about baseline screening and risk          Current Outpatient Prescriptions   Medication Sig Dispense Refill   . Albuterol Sulfate HFA (PROAIR HFA) 108 (90 Base) MCG/ACT Inhalation Aero Soln Inhale 2 puffs by mouth every 4 hours as needed. And 15 minutes prior to exercise for asthma 1 Inhaler 2   . Aspirin 81 MG Oral Tab Take 81 mg by mouth daily.     . Cyclobenzaprine HCl (FLEXERIL) 10 MG Oral Tab Take 1 tablet (10 mg) by mouth at bedtime as needed for muscle spasms. For muscle pain. 60 tablet 0   . Diclofenac Sodium 1 % Transdermal Gel Apply 2 g to affected area on neck 4 times a day as needed (pain). 3 Tube 0   . Diclofenac Sodium 75 MG Oral Tab EC Take 1 tablet (75 mg) by mouth 2 times a day. 60 tablet 1   . Glucosamine 500 MG Oral Cap Take 500 mg by mouth 2 times a day.     . Montelukast Sodium 10 MG Oral Tab  take 1 tablet by mouth every evening 90 tablet 0   . Montelukast Sodium 10 MG Oral Tab Take 1 tablet (10 mg) by mouth every evening. 90 tablet 0   . OxyCODONE HCl 5 MG Oral Tab Take 1 tablet (5 mg) by mouth every 4 hours as needed for pain. 15 tablet 0     No current facility-administered medications for this visit.        Drug-drug interaction screen with current medications in EPIC as of 01/03/16:   Drug Interactions Evaluated:  yes  Clinically Relevant Drug Interactions Identified:  no         Baseline labs reviewed (see below)  Component      Latest Ref Rng & Units 09/18/2015           8:00 AM   Liver Fibr. Fibrosis Score       0.27   Liver Fibr. Fibrosis Stage       RESULT: F0   Liver Fibr. Fibrosis Interp       (NOTE)   Liver Fibr. Necroinf Act Score  0.34   Liver Fibr. Necroinf Act Grade       A1   Liver Fibr. Necroinf. Interp       (NOTE)   Liver Fibr. Alpha-2-Macroglob      106 - 279 mg/dL 960   Liver Fibr. Haptoglobulin      43 - 212 mg/dL 92   Liver Fibr. Apolipoprotein A-1      94 - 176 mg/dL 454 (H)   Liver Fibr. Total Bilirubin      0.2 - 1.2 mg/dL 0.4   Liver Fibr. GGT      3 - 85 U/L 79   Liver Fibr. ALT      9 - 46 U/L 59 (H)   Liver Fibr. Reference ID       [id number]   Liver Fibr. Footnote       (NOTE)     Component      Latest Ref Rng & Units 12/26/2014           1:34 PM   WBC      4.3 - 10.0 10*3/uL 8.14   RBC      4.40 - 5.60 10*6/uL 4.60   Hemoglobin      13.0 - 18.0 g/dL 47.8   Hematocrit      38 - 50 % 44   MCV      81 - 98 fL 95   MCH      27.3 - 33.6 pg 32.2   MCHC      32.2 - 36.5 g/dL 29.5   Platelet Count      150 - 400 10*3/uL 253   RDW-CV      11.6 - 14.4 % 12.3   % Neutrophils      % 58   % Lymphocytes      % 28   % Monocytes      % 10   % Eosinophils      % 3   % Basophils      % 1   % Immature Granulocytes      % 0   Neutrophils      1.80 - 7.00 10*3/uL 4.82   Absolute Lymphocyte Count      1.00 - 4.80 10*3/uL 2.24   Monocytes      0.00 - 0.80 10*3/uL 0.77   Absolute Eosinophil  Count      0.00 - 0.50 10*3/uL 0.23   Basophils      0.00 - 0.20 10*3/uL 0.06   Immature Granulocytes      0.00 - 0.05 10*3/uL 0.02   Nucleated RBC      0.00 10*3/uL 0.00   % Nucleated RBC      % 0     Component      Latest Ref Rng & Units 12/26/2014           1:34 PM   Sodium      135 - 145 meq/L 138   Potassium      3.6 - 5.2 meq/L 4.0   Chloride      98 - 108 meq/L 103   Carbon Dioxide, Total      22 - 32 meq/L 31   Anion Gap      4 - 12 4   Glucose      62 - 125 mg/dL 621   Urea Nitrogen      8 - 21 mg/dL 19   Creatinine      3.08 -  1.18 mg/dL 1.611.07   Protein (Total)      6.0 - 8.2 g/dL 7.0   Albumin      3.5 - 5.2 g/dL 4.3   Bilirubin (Total)      0.2 - 1.3 mg/dL 0.3   Calcium      8.9 - 10.2 mg/dL 9.4   AST (GOT)      9 - 38 U/L 34   Alkaline Phosphatase (Total)      37 - 159 U/L 47   ALT (GPT)      10 - 48 U/L 69 (H)   GFR, Calc, European American      >59 mL/min >60   GFR, Calc, African American      >59 mL/min >60   GFR, Information       Calculated GFR in mL/min/1.73 m2 by MDRD equation.  Inaccurate with changing renal function.  See [email or web address]     Component      Latest Ref Rng & Units 12/26/2014           1:34 PM   Hepatitis C Quant Result      NDET [IU]/mL 096,045115,642 (A)   Hcv RNA Iu/Ml (Log 10)      Log:IU/mL 5.1   Hepatitis C Quant Interp       The limit of quantitative detection (the minimum virus level that gives a positive result in 95% of replicates) is 12 IU/mL (1.08 log IU/mL). Quantitative results less than 12 IU/mL are described as . . .          [facility] for Ronald Page 3 tabs daily X 8 weeks; second fill needs [facility] go [facility].     Ronald Page, Ronald Page, Technician  [employer] Pharmacist Pool            Appt: 01/05/16     RX APPROVAL     Patient Name: Ronald Page, Ronald Page   MRN: [id number]   Referral #: [id number]     Prescription Insurance: PHARMACY BENEFIT MANAGER PAID/[employer]   Pharmacy Coverage Subscriber ID:  [id number]   Group Number: [id number]     Patient has received insurance approval for coverage of the following medication:     Medication: Ronald Page 100-40MG  TAB     Approval Dates: 01/02/2016 to 02/27/2016     Approval #: [id number]     Pharmacy Prescription Sent to: [facility]     Reason for this Pharmacy Choice: Patient Choice/ Insurance Contract     Cost of Medication to Patient: $5.00, after copay card     Financial Assistance: co-pay card     Additional Comments: Ronald Page approved for 8-weeks.  [facility], but second fill needs to go through [facility]: Epic ID: [id number] Phone: [phone]

## 2016-01-05 ENCOUNTER — Ambulatory Visit (HOSPITAL_BASED_OUTPATIENT_CLINIC_OR_DEPARTMENT_OTHER): Payer: No Typology Code available for payment source | Attending: Infectious Disease | Admitting: Pharmacist

## 2016-01-05 VITALS — BP 164/99 | HR 72 | Temp 97.9°F | Resp 16 | Wt 214.3 lb

## 2016-01-05 DIAGNOSIS — B182 Chronic viral hepatitis C: Secondary | ICD-10-CM | POA: Insufficient documentation

## 2016-01-05 MED ORDER — GLECAPREVIR-PIBRENTASVIR 100-40 MG OR TABS
3.0000 | ORAL_TABLET | Freq: Every day | ORAL | 0 refills | Status: DC
Start: 2016-01-05 — End: 2016-06-13

## 2016-01-05 NOTE — Progress Notes (Signed)
ID  Visit type: Appointed  Interpreter present: None  Referral type: Written by Dr. Lorin PicketScott on 05/26/15 for Hep C treatment initiation and monitoring.     SUBJECTIVE  Ronald Page is a 55 year old male who presents to clinic for Hepatitis C treatment initiation. Pt has been approved for treatment with Mavyret by [employer] for 8 weeks.  Pt has been diagnosed with HCV Genotype: 1a; Fibrosis stage: F0 - F1; Fibrosis stage based on: fibrosure; Cirrhosis status: N/A; Child Pugh Class: N/A; Treatment Status: treatment naive.     He is here today to discuss starting treatment with Mavyret daily X 8 weeks.   We reviewed his current medications and there are no drug-drug interactions with his current medications and Mavyret.   Pt denies taking any other OTC medications, and denies taking any supplements and herbal products.  Pt denies any tobacco, marijuana, and illicit drugs.Pt occasionally drinks beer, but he is willing to stop all alcohol during his HCV treatment with Mavyret.     Pt says that he has never been vaccinated against HAV or HBV and he does not want to get vaccinated here. He is willing to get vaccinated by his PCP. We discussed that this is recommended for all liver disease patients, and I reviewed that he can get the vaccine while he's on treatment no problem. We discussed that vaccine is for HepA/B and should be given at 0, 1, and 6 months. Pt assures me that he will ask his PCP to start him on the vaccine.     Pt [facility] for month 1, and then the second month will [facility]. For a list of the full education topics covered with the patient, please see below.       GT 1a  HCV RNA level: 115,642 IU/ml  HIV negative 10 yrs ago  Fibrosis level:F0 on liver bx 2009    HAV status: Unknown - Negative 2008   HBV status: Unknown - Negative 2008   HIV status: Pt got screened 28 years ago and he was negative; no risk exposure since  screened        Hep C Treatment Summary  HCV Genotype: 1a  Previously treated: NO    Prescribed Regimen:   Mavyret (Glecaprevir/Pibrentasvir) 100mg /40mg  - 3 tabs PO daily, Disp QTY: 84, Refill:  1  Duration of Therapy: 8 weeks   Start Date: 01/05/16  Estimated End Date:03/01/16    Pharmacy:   Harrie JeansViger, Gena R, Technician  [facility] Pharmacist Pool            Appt: 01/05/16     RX APPROVAL     Patient Name: Ronald Page, [name]   MRN: [id number]   Referral #: [id number]     Prescription Insurance: PHARMACY BENEFIT MANAGER PAID/[employer]   Pharmacy Coverage Subscriber ID: [id number]   Group Number: [id number]     Patient has received insurance approval for coverage of the following medication:     Medication: MAVYRET 100-40MG  TAB     Approval Dates: 01/02/2016 to 02/27/2016     Approval #: [id number]     Pharmacy Prescription Sent to: [facility]     Reason for this Pharmacy Choice: Patient Choice/ Insurance Contract     Cost of Medication to Patient: $5.00, after copay card     Financial Assistance: co-pay card     Additional Comments: Mavyret approved for 8-weeks.  First fill at [facility] pharmacy, but second fill needs to go through [facility]: Epic ID: [id number] Phone: [phone]        PROBLEM LIST   Patient Active Problem List   Diagnosis   . Bradycardia   . Fatigue   . Hepatitis C   . HTN (hypertension)   . Asthma   . Hearing loss       ALLERGIES   Review of patient's allergies indicates:  No Known Allergies      MEDICATIONS:  Current Outpatient Prescriptions   Medication Sig Dispense Refill   . Albuterol Sulfate HFA (PROAIR HFA) 108 (90 Base) MCG/ACT Inhalation Aero Soln Inhale 2 puffs by mouth every 4 hours as needed. And 15 minutes prior to exercise for asthma 1 Inhaler 2   . Aspirin 81 MG Oral Tab Take 81 mg by mouth daily.     . Cyclobenzaprine HCl (FLEXERIL) 10 MG Oral Tab Take 1 tablet (10 mg) by mouth at bedtime as needed for muscle spasms. For muscle pain. 60 tablet 0   . Diclofenac  Sodium 1 % Transdermal Gel Apply 2 g to affected area on neck 4 times a day as needed (pain). 3 Tube 0   . Diclofenac Sodium 75 MG Oral Tab EC Take 1 tablet (75 mg) by mouth 2 times a day. 60 tablet 1   . Glecaprevir-Pibrentasvir (MAVYRET) 100-40 MG Oral Tab Take 3 tablets by mouth daily. Take with food. 84 tablet 0   . Glucosamine 500 MG Oral Cap Take 500 mg by mouth 2 times a day.     . Montelukast Sodium 10 MG Oral Tab take 1 tablet by mouth every evening 90 tablet 0   . Montelukast Sodium 10 MG Oral Tab Take 1 tablet (10 mg) by mouth every evening. 90 tablet 0   . OxyCODONE HCl 5 MG Oral Tab Take 1 tablet (5 mg) by mouth every 4 hours as needed for pain. 15 tablet 0     No current facility-administered medications for this visit.          RESULTS REVIEW  Vitals  BP (!) 164/99   Pulse 72   Temp 97.9 F (36.6 C) (Temporal)   Resp 16   Wt 214 lb 4.6 oz (97.2 kg)   BMI 29.06 kg/m     Labs         Current Outpatient Prescriptions   Medication Sig Dispense Refill   . Albuterol Sulfate HFA (PROAIR HFA) 108 (90 Base) MCG/ACT Inhalation Aero Soln Inhale 2 puffs by mouth every 4 hours as needed. And 15 minutes prior to exercise for asthma 1 Inhaler 2   . Aspirin 81 MG Oral Tab Take 81 mg by mouth daily.     Marland Kitchen Glecaprevir-Pibrentasvir (MAVYRET) 100-40 MG Oral Tab Take 3 tablets by mouth daily. Take with food. 84 tablet 0   . Montelukast Sodium 10 MG Oral Tab take 1 tablet by mouth every evening 90 tablet 0   . Montelukast Sodium 10 MG Oral Tab Take 1 tablet (10 mg) by mouth every evening. 90 tablet 0     No current facility-administered medications for this visit.        Baseline labs reviewed (see below)  Component      Latest Ref Rng & Units 09/18/2015           8:00 AM   Liver Fibr. Fibrosis Score       0.27   Liver Fibr. Fibrosis Stage  RESULT: F0   Liver Fibr. Fibrosis Interp       (NOTE)   Liver Fibr. Necroinf Act Score       0.34   Liver Fibr. Necroinf Act Grade       A1   Liver Fibr. Necroinf. Interp        (NOTE)   Liver Fibr. Alpha-2-Macroglob      106 - 279 mg/dL 161   Liver Fibr. Haptoglobulin      43 - 212 mg/dL 92   Liver Fibr. Apolipoprotein A-1      94 - 176 mg/dL 096 (H)   Liver Fibr. Total Bilirubin      0.2 - 1.2 mg/dL 0.4   Liver Fibr. GGT      3 - 85 U/L 79   Liver Fibr. ALT      9 - 46 U/L 59 (H)   Liver Fibr. Reference ID       [id number]   Liver Fibr. Footnote       (NOTE)     Component      Latest Ref Rng & Units 12/26/2014           1:34 PM   WBC      4.3 - 10.0 10*3/uL 8.14   RBC      4.40 - 5.60 10*6/uL 4.60   Hemoglobin      13.0 - 18.0 g/dL 11.9   Hematocrit      38 - 50 % 44   MCV      81 - 98 fL 95   MCH      27.3 - 33.6 pg 32.2   MCHC      32.2 - 36.5 g/dL 14.7   Platelet Count      150 - 400 10*3/uL 253   RDW-CV      11.6 - 14.4 % 12.3   % Neutrophils      % 58   % Lymphocytes      % 28   % Monocytes      % 10   % Eosinophils      % 3   % Basophils      % 1   % Immature Granulocytes      % 0   Neutrophils      1.80 - 7.00 10*3/uL 4.82   Absolute Lymphocyte Count      1.00 - 4.80 10*3/uL 2.24   Monocytes      0.00 - 0.80 10*3/uL 0.77   Absolute Eosinophil Count      0.00 - 0.50 10*3/uL 0.23   Basophils      0.00 - 0.20 10*3/uL 0.06   Immature Granulocytes      0.00 - 0.05 10*3/uL 0.02   Nucleated RBC      0.00 10*3/uL 0.00   % Nucleated RBC      % 0     Component      Latest Ref Rng & Units 12/26/2014           1:34 PM   Sodium      135 - 145 meq/L 138   Potassium      3.6 - 5.2 meq/L 4.0   Chloride      98 - 108 meq/L 103   Carbon Dioxide, Total      22 - 32 meq/L 31   Anion Gap      4 - 12 4   Glucose      62 -  125 mg/dL 161   Urea Nitrogen      8 - 21 mg/dL 19   Creatinine      0.96 - 1.18 mg/dL 0.45   Protein (Total)      6.0 - 8.2 g/dL 7.0   Albumin      3.5 - 5.2 g/dL 4.3   Bilirubin (Total)      0.2 - 1.3 mg/dL 0.3   Calcium      8.9 - 10.2 mg/dL 9.4   AST (GOT)      9 - 38 U/L 34   Alkaline Phosphatase (Total)      37 - 159 U/L 47   ALT (GPT)      10 - 48 U/L 69 (H)   GFR, Calc, European  American      >59 mL/min >60   GFR, Calc, African American      >59 mL/min >60   GFR, Information       Calculated GFR in mL/min/1.73 m2 by MDRD equation.  Inaccurate with changing renal function.  See [email or web address].it.[email or web address]     Component      Latest Ref Rng & Units 12/26/2014           1:34 PM   Hepatitis C Quant Result      NDET [IU]/mL 409,811 (A)   Hcv RNA Iu/Ml (Log 10)      Log:IU/mL 5.1   Hepatitis C Quant Interp       The limit of quantitative detection (the minimum virus level that gives a positive result in 95% of replicates) is 12 IU/mL (1.08 log IU/mL). Quantitative results less than 12 IU/mL are described as . . .         REVIEW OF SYSTEMS  Unable to perform a full ROS:  Yes  *All other systems reviewed and are negative             ASSESSMENT/PLAN    Hepatitis C Treatment:   Reuel Lamadrid is a 55 year old male who will be starting therapy with Mavyret 3 tabs daily X 8 weeks as noted above on 9/22, estimate stop date 11/17. Baseline labs up to date. Patient will follow-up in 1 month.      NOTE: Pt is not immune to HAV or HBV, so recommended vaccination and pt states will pursue with PCP.     Possible drug interactions:   Drug Interactions Evaluated:  yes  Clinically Relevant Drug Interactions Identified:  no  Provided the Patient With Educational Material Regarding Drug Interactions:  yes  Comments:  [employer] teaching booklet   EPIC AVS         Medication Review:  Medication(s) Clinically Reviewed:  yes  Drug(s):  Pt has been diagnosed with HCV Genotype: 1a; Fibrosis stage: F0 - F1; Fibrosis stage based on: fibrosure; Cirrhosis status: N/A; Child Pugh Class: N/A; Treatment Status: treatment naive. Mavyret 3 pills daily X 8 weeks is appropriate.   Indication Appropriate:  yes  Medication Optimized:  yes         Follow-Up and Monitoring:    Adherence  Demonstrates Understanding of Importance of Adherence:  yes  Confirmed Plan for Next Specialty Medication Refill:   pick-up at pharmacy  Refills Needed for Supportive Medications:  not needed           Education/Patient Counseling:    Counseled the Patient on the Following:  possible drug interactions, adherence and missed doses, cost of medications/cost implications, doses and administration, lab monitoring  and follow-up, possible adverse effects and management, safe handling, storage, and disposal, therapeutic rationale, pharmacy contact information         -Disease overview  -Indication for treatment and rationale for multiple agents  -Dosage and administration of Mavyret (glecaprevir-pibrentasvir) - Three tablets taken orally once daily - with food  -Importance of storing Mavyret (glecaprevir-pibrentasvir) safely as it is not replaceable if damaged or lost; dispensed in a 4 week (monthly) or 8 week carton (two months)  -Importance of adherence and what to do in case of a missed dose; if less than 18 hours since missed dose, then take the dose as soon as possible; if greater than 18 hours since missed dose, then skip missed dose and take the next dose a the usual time   -Need for lab monitoring and importance of keeping follow-up appointments  -How and when to contact the pharmacy for refills  -Potential side effects: headache, fatigue, nausea, diarrhea, and pruritus     -Potential for drug interactions: rifamycins, atazanavir, darunavir, lopinavir, ritonavir, efavirenz, carbamazepine, digoxin, dabigatran, ethinyl estradiol, atorvastatin, lovastatin, simvastatin, pravastatin, rosuvastatin, fluvastatin, pitavastatin, cyclosporine, PPIs, St. John's wort and other OTC herbals/supplements  -Review safe practices to minimize transmission of HCV  -Review risk of HBV reactivation while on HCV treatment    F/U Protocol GLE/PIB:  Baseline Screen HBcAb, HBsAb, HBsAg     New Start                     Pharmacist visit 01/05/16  4 weeks         Hepatic panel, Viral load  Pharmacist visit 10/20  8 weeks          Hepatic panel, Viral  load  Provider visit 11/17    [facility]  Hepatic panel, Viral load   Provider visit TBD    Additional monitoring may be needed for HBsAg positive, isolated anti-HBc, or for anti-HBs and anti-HBc (immune recovery).  See AASLD Guidance.         TIME SPENT  I spent a total time of 60 minutes face-to-face with the patient, of which more than 50% was spent counseling and coordinating care as outlined in this note.      Sharnell Knight L. Trula Slade, PharmD, [facility]  Clinical Pharmacist   [facility]   Pager#: [phone]

## 2016-01-05 NOTE — Patient Instructions (Signed)
Pharmacist Visit for Hepatitis C Treatment: First Visit    You were seen by: Rayfield Citizenaroline L. Trula SladePitney, PharmD, BCACP      Contact Information: [phone]    (When calling, please say that you are calling about your Hepatitis C treatment)    Please continue the following medications (01/05/2016):  You will be on treatment for total of 8 weeks     . Mavyret (glecaprevir-pibrentasvir) -Take 3 tablets by mouth daily with food.     -Dosage and administration of Mavyret (glecaprevir-pibrentasvir) - Three tablets taken orally once daily - with food  -Importance of storing Mavyret (glecaprevir-pibrentasvir) safely as it is not replaceable if damaged or lost; dispensed in a 4 week (monthly) or 8 week carton (two months)  -Importance of adherence and what to do in case of a missed dose; if less than 18 hours since missed dose, then take the dose as soon as possible; if greater than 18 hours since missed dose, then skip missed dose and take the next dose a the usual time   -Potential side effects: headache, fatigue, nausea, diarrhea, and itching  -Potential for drug interactions: rifamycins, atazanavir, darunavir, lopinavir, ritonavir, efavirenz, carbamazepine, digoxin, dabigatran, ethinyl estradiol, atorvastatin, lovastatin, simvastatin, pravastatin, rosuvastatin, fluvastatin, pitavastatin, cyclosporine, PPIs, St. John's wort and other OTC herbals/supplements    Your Pharmacy that fills your medications:  Approval Dates:  01/02/2016 to 02/27/2016  Approval #: [id number]  Pharmacy Prescription Sent to: [facility] --> Phone: [phone]  Cost of Medication to Patient: $5.00, after copay card    Mavyret copay card:      Please get the following labs done prior to checking in at your next Hepatitis appointment:      Liver function tests or hepatic panel   Hepatitis C Viral Load       Other important information:  1. If you have any concerns and issues with your Hepatitis C medications, don't hesitate to contact the pharmacist or  provider.    2. Please properly store all your medications.  . Many of the medications are difficult to replace if lost or destroyed.  . Some medications may need to be kept in a refrigerator.    3. Please come to all appointments and get your labs done as instructed.  If you are unable to come in for a scheduled visit, please contact your clinic directly.    4. Most hospitals or other facilities will not be able to provide HCV treatment during an inpatient visit.  Please be prepared to bring your own medication for planned admissions or have a family member or friend available to bring you your medications in case of emergency.    5. In most cases, prescriptions for Hepatitis C treatment go through a very difficult authorization process with your insurance plan:  Marland Kitchen. Please do not make any changes with your insurance coverage without talking with your pharmacist.  . If you receive any document or notice of change of your insurance coverage, please contact your pharmacist immediately.

## 2016-02-02 ENCOUNTER — Ambulatory Visit (HOSPITAL_BASED_OUTPATIENT_CLINIC_OR_DEPARTMENT_OTHER): Payer: No Typology Code available for payment source | Attending: Infectious Disease | Admitting: Pharmacist

## 2016-02-02 ENCOUNTER — Other Ambulatory Visit (HOSPITAL_BASED_OUTPATIENT_CLINIC_OR_DEPARTMENT_OTHER): Payer: Self-pay | Admitting: Pharmacist

## 2016-02-02 VITALS — BP 165/96 | HR 78 | Temp 97.0°F | Resp 16 | Ht 72.0 in | Wt 210.8 lb

## 2016-02-02 DIAGNOSIS — B182 Chronic viral hepatitis C: Secondary | ICD-10-CM | POA: Insufficient documentation

## 2016-02-02 LAB — HEPATITIS A IMMUNE STATUS IGG: Hepatitis A IgG Antibody: NONREACTIVE

## 2016-02-02 LAB — HEPATIC FUNCTION PANEL
ALT (GPT): 18 U/L (ref 10–48)
AST (GOT): 14 U/L (ref 9–38)
Albumin: 3.9 g/dL (ref 3.5–5.2)
Alkaline Phosphatase (Total): 53 U/L (ref 37–159)
Bilirubin (Direct): 0.2 mg/dL (ref 0.0–0.3)
Bilirubin (Total): 0.5 mg/dL (ref 0.2–1.3)
Protein (Total): 7 g/dL (ref 6.0–8.2)

## 2016-02-02 LAB — HEPATITIS B CORE AB (HBCAB): Hepatitis B Core Ab: NONREACTIVE

## 2016-02-02 LAB — HEPATITIS B SURFACE AG & AB
Hepatitis B Surf Antibody Intl Units: <8 [IU]
Hepatitis B Surface Ab: NONREACTIVE
Hepatitis B Surface Antigen w/Reflex: NONREACTIVE

## 2016-02-03 NOTE — Progress Notes (Signed)
ID  Visit type: Appointed  Interpreter present: None  Referral type: Written by Dr. Lorin Picket on 05/26/15 for Hep C treatment initiation and monitoring.     SUBJECTIVE  Ronald Page is a 55 year old male who presents to clinic for Hepatitis C treatment follow-up.   Pt has been approved for treatment with Mavyret by [employer] for 8 weeks.    Pt has been diagnosed with HCV Genotype: 1a; Fibrosis stage: F0 - F1; Fibrosis stage based on: fibrosure; Cirrhosis status: N/A; Child Pugh Class: N/A; Treatment Status: treatment naive.     Pt states that he has completed the first month of treatment out of the 8 week course of Mavyret.   He has not missed any doses.     He endorses headaches with treatment and fatigue.   Pt states that he did drink 2 beers over the past 4 weeks.   We reviewed that it is best to not drink any alcohol during his HCV treatment with Mavyret to ensure that he has the best chance to cure his HCV infection.   No changes to his current medications. Pt denies taking any other OTC medications, and denies taking any supplements and herbal products. Pt denies any marijuana, and illicit drugs.    Pt says that he has never been vaccinated against HAV or HBV and he does not want to get vaccinated here. .     Pt states that he has received the second bottle of the Mavyret from [facility].  For a list of the full education topics covered with the patient, please see below.       GT 1a  HCV RNA level: 115,642 IU/ml  HIV negative 10 yrs ago  Fibrosis level:F0 on liver bx 2009    HAV status: Unknown - Negative 2008   HBV status: Unknown - Negative 2008   HIV status: Pt got screened 28 years ago and he was negative; no risk exposure since screened        Hep C Treatment Summary  HCV Genotype: 1a  Previously treated: NO    Prescribed Regimen:   Mavyret (Glecaprevir/Pibrentasvir) 100mg /40mg  - 3 tabs PO daily, Disp QTY: 84, Refill:  1  Duration of Therapy: 8 weeks   Start Date:  01/05/16  Estimated End Date:03/01/16    Pharmacy:   Harrie Jeans, Technician  [facility] Pharmacist Pool            Appt: 01/05/16     RX APPROVAL     Patient Name: Ronald Page, Ronald Page   MRN: [id number]   Referral #: [id number]     Prescription Insurance: PHARMACY BENEFIT MANAGER PAID/[employer]   Pharmacy Coverage Subscriber ID: [id number]   Group Number: [id number]     Patient has received insurance approval for coverage of the following medication:     Medication: MAVYRET 100-40MG  TAB     Approval Dates: 01/02/2016 to 02/27/2016     Approval #: [id number]     Pharmacy Prescription Sent to: [facility]     Reason for this Pharmacy Choice: Patient Choice/ Insurance Contract     Cost of Medication to Patient: $5.00, after copay card     Financial Assistance: co-pay card     Additional Comments: Mavyret approved for 8-weeks.  First fill at [facility] pharmacy, but second fill needs to go through [facility]: Epic ID: [id number] Phone: [phone]        PROBLEM LIST   Patient  Active Problem List   Diagnosis   . Bradycardia   . Fatigue   . Hepatitis C   . HTN (hypertension)   . Asthma   . Hearing loss       ALLERGIES   Review of patient's allergies indicates:  No Known Allergies      MEDICATIONS:  Current Outpatient Prescriptions   Medication Sig Dispense Refill   . Albuterol Sulfate HFA (PROAIR HFA) 108 (90 Base) MCG/ACT Inhalation Aero Soln Inhale 2 puffs by mouth every 4 hours as needed. And 15 minutes prior to exercise for asthma 1 Inhaler 2   . Aspirin 81 MG Oral Tab Take 81 mg by mouth daily.     Marland Kitchen. Glecaprevir-Pibrentasvir (MAVYRET) 100-40 MG Oral Tab Take 3 tablets by mouth daily. Take with food. 84 tablet 0   . Montelukast Sodium 10 MG Oral Tab take 1 tablet by mouth every evening 90 tablet 0     No current facility-administered medications for this visit.          RESULTS REVIEW  Vitals  BP (!) 165/96   Pulse 78   Temp 97 F (36.1 C) (Temporal)   Resp 16   Ht 6' (1.829 m)   Wt 210 lb  12.2 oz (95.6 kg)   BMI 28.58 kg/m     Labs  HCV RNA Quant is PENDING from today's visit   Component      Latest Ref Rng & Units 02/02/2016          10:26 AM   Albumin      3.5 - 5.2 g/dL 3.9   Protein (Total)      6.0 - 8.2 g/dL 7.0   Bilirubin (Total)      0.2 - 1.3 mg/dL 0.5   Bilirubin (Direct)      0.0 - 0.3 mg/dL 0.2   Alkaline Phosphatase (Total)      37 - 159 U/L 53   AST (GOT)      9 - 38 U/L 14   ALT (GPT)      10 - 48 U/L 18      Component      Latest Ref Rng & Units 02/02/2016          10:27 AM   Hepatitis A Antibody Ig Class       Nonreactive   Hep A IgG Interpretation       No evidence of past Hepatitis A infection or immunization. If recent Hepatitis A infection is suspected, order Hepatitis A IgM test.     Component      Latest Ref Rng & Units 02/02/2016          10:27 AM   Hepatitis B S Ag w/Rflx PCR      NREAC Nonreactive   Hepatitis B Surface Ab       Nonreactive   Hepatitis B Surf Antibody Intl Units      [IU] <8.00   Hepatitis B Surf Ag and Antibody Interp       No evidence of recent or resolved Hepatitis B infection. If Hepatitis B infection suspected in the past 6 to 9 months, consider Hepatitis B core IgG and/or Hepatitis B core IgM testing.     Component      Latest Ref Rng & Units 02/02/2016          10:27 AM   Hepatitis B Core Ab      NREAC Nonreactive   Hepatitis B Core Antibody Interp  Antibody to the Hepatitis B core antigen (anti-HBc) was not detected.         Current Outpatient Prescriptions   Medication Sig Dispense Refill   . Albuterol Sulfate HFA (PROAIR HFA) 108 (90 Base) MCG/ACT Inhalation Aero Soln Inhale 2 puffs by mouth every 4 hours as needed. And 15 minutes prior to exercise for asthma 1 Inhaler 2   . Aspirin 81 MG Oral Tab Take 81 mg by mouth daily.     Marland Kitchen Glecaprevir-Pibrentasvir (MAVYRET) 100-40 MG Oral Tab Take 3 tablets by mouth daily. Take with food. 84 tablet 0   . Montelukast Sodium 10 MG Oral Tab take 1 tablet by mouth every evening 90 tablet 0   . Montelukast  Sodium 10 MG Oral Tab Take 1 tablet (10 mg) by mouth every evening. 90 tablet 0     No current facility-administered medications for this visit.        Baseline labs reviewed (see below)  Component      Latest Ref Rng & Units 09/18/2015           8:00 AM   Liver Fibr. Fibrosis Score       0.27   Liver Fibr. Fibrosis Stage       RESULT: F0   Liver Fibr. Fibrosis Interp       (NOTE)   Liver Fibr. Necroinf Act Score       0.34   Liver Fibr. Necroinf Act Grade       A1   Liver Fibr. Necroinf. Interp       (NOTE)   Liver Fibr. Alpha-2-Macroglob      106 - 279 mg/dL 454   Liver Fibr. Haptoglobulin      43 - 212 mg/dL 92   Liver Fibr. Apolipoprotein A-1      94 - 176 mg/dL 098 (H)   Liver Fibr. Total Bilirubin      0.2 - 1.2 mg/dL 0.4   Liver Fibr. GGT      3 - 85 U/L 79   Liver Fibr. ALT      9 - 46 U/L 59 (H)   Liver Fibr. Reference ID       [id number]   Liver Fibr. Footnote       (NOTE)     Component      Latest Ref Rng & Units 12/26/2014           1:34 PM   WBC      4.3 - 10.0 10*3/uL 8.14   RBC      4.40 - 5.60 10*6/uL 4.60   Hemoglobin      13.0 - 18.0 g/dL 29.5   Hematocrit      38 - 50 % 44   MCV      81 - 98 fL 95   MCH      27.3 - 33.6 pg 32.2   MCHC      32.2 - 36.5 g/dL 62.1   Platelet Count      150 - 400 10*3/uL 253   RDW-CV      11.6 - 14.4 % 12.3   % Neutrophils      % 58   % Lymphocytes      % 28   % Monocytes      % 10   % Eosinophils      % 3   % Basophils      % 1   % Immature Granulocytes      %  0   Neutrophils      1.80 - 7.00 10*3/uL 4.82   Absolute Lymphocyte Count      1.00 - 4.80 10*3/uL 2.24   Monocytes      0.00 - 0.80 10*3/uL 0.77   Absolute Eosinophil Count      0.00 - 0.50 10*3/uL 0.23   Basophils      0.00 - 0.20 10*3/uL 0.06   Immature Granulocytes      0.00 - 0.05 10*3/uL 0.02   Nucleated RBC      0.00 10*3/uL 0.00   % Nucleated RBC      % 0     Component      Latest Ref Rng & Units 12/26/2014           1:34 PM   Sodium      135 - 145 meq/L 138   Potassium      3.6 - 5.2 meq/L 4.0   Chloride       98 - 108 meq/L 103   Carbon Dioxide, Total      22 - 32 meq/L 31   Anion Gap      4 - 12 4   Glucose      62 - 125 mg/dL 161   Urea Nitrogen      8 - 21 mg/dL 19   Creatinine      0.96 - 1.18 mg/dL 0.45   Protein (Total)      6.0 - 8.2 g/dL 7.0   Albumin      3.5 - 5.2 g/dL 4.3   Bilirubin (Total)      0.2 - 1.3 mg/dL 0.3   Calcium      8.9 - 10.2 mg/dL 9.4   AST (GOT)      9 - 38 U/L 34   Alkaline Phosphatase (Total)      37 - 159 U/L 47   ALT (GPT)      10 - 48 U/L 69 (H)   GFR, Calc, European American      >59 mL/min >60   GFR, Calc, African American      >59 mL/min >60   GFR, Information       Calculated GFR in mL/min/1.73 m2 by MDRD equation.  Inaccurate with changing renal function.  See [email or web address].it.[email or web address]     Component      Latest Ref Rng & Units 12/26/2014           1:34 PM   Hepatitis C Quant Result      NDET [IU]/mL 409,811 (A)   Hcv RNA Iu/Ml (Log 10)      Log:IU/mL 5.1   Hepatitis C Quant Interp       The limit of quantitative detection (the minimum virus level that gives a positive result in 95% of replicates) is 12 IU/mL (1.08 log IU/mL). Quantitative results less than 12 IU/mL are described as . . .         REVIEW OF SYSTEMS  Fatigue:  Pos   Headaches:  Pos   Dysphoric mood:  Pos   Sleep disturbance:  Pos             ASSESSMENT/PLAN    Hepatitis C Treatment:   Yoshimi Sarr is a 55 year old male who will be starting therapy with Mavyret 3 tabs daily X 8 weeks as noted above on 9/22, estimate stop date 11/17. NOTE: Pt is not immune to HAV or HBV, so recommended vaccination and pt  states will pursue with PCP.     Hepatitis C Treatment Follow up:  1. Adherence - 100%  2. Side Effect Management - Headache and fatigue  3. Laboratory Monitoring - HFP is normal; HCV RNA Quant is PENDING from today;   4. Plan - Continue Mavyret for 4 more weeks to complete the 8 week course; encouraged patient to stop drinking all beer or alcohol especially during HCV treatment, pt  needs to be vaccinated against HAV and HBV and wishes to pursue this with his PCP    Possible drug interactions:   Drug Interactions Evaluated:  yes  Clinically Relevant Drug Interactions Identified:  no  Provided the Patient With Educational Material Regarding Drug Interactions:  not applicable         Medication Review:  Medication(s) Clinically Reviewed:  yes  Drug(s):  Pt has been approved for treatment with Mavyret by [employer] for 8 weeks.  Pt has been diagnosed with HCV Genotype: 1a; Fibrosis stage: F0 - F1; Fibrosis stage based on: fibrosure; Cirrhosis status: N/A; Child Pugh Class: N/A; Treatment Status: treatment naive.   Indication Appropriate:  yes  Medication Optimized:  yes         Follow-Up and Monitoring:    Adherence  Demonstrates Understanding of Importance of Adherence:  yes  Refills Needed for Supportive Medications:  not needed           Education/Patient Counseling:    Counseled the Patient on the Following:  possible food interactions, possible drug interactions, adherence and missed doses, doses and administration, lab monitoring and follow-up, possible adverse effects and management, safe handling, storage, and disposal, therapeutic rationale         -Disease overview  -Indication for treatment and rationale for multiple agents  -Dosage and administration of Mavyret (glecaprevir-pibrentasvir) - Three tablets taken orally once daily - with food  -Importance of storing Mavyret (glecaprevir-pibrentasvir) safely as it is not replaceable if damaged or lost; dispensed in a 4 week (monthly) or 8 week carton (two months)  -Importance of adherence and what to do in case of a missed dose; if less than 18 hours since missed dose, then take the dose as soon as possible; if greater than 18 hours since missed dose, then skip missed dose and take the next dose a the usual time   -Need for lab monitoring and importance of keeping follow-up appointments  -How and when to contact the pharmacy for  refills  -Potential side effects: headache, fatigue, nausea, diarrhea, and pruritus     -Potential for drug interactions: rifamycins, atazanavir, darunavir, lopinavir, ritonavir, efavirenz, carbamazepine, digoxin, dabigatran, ethinyl estradiol, atorvastatin, lovastatin, simvastatin, pravastatin, rosuvastatin, fluvastatin, pitavastatin, cyclosporine, PPIs, St. John's wort and other OTC herbals/supplements  -Review safe practices to minimize transmission of HCV  -Review risk of HBV reactivation while on HCV treatment    F/U Protocol GLE/PIB:    8 weeks          Hepatic panel, Viral load  Provider visit 11/17    [facility]  Hepatic panel, Viral load   Provider visit TBD    Additional monitoring may be needed for HBsAg positive, isolated anti-HBc, or for anti-HBs and anti-HBc (immune recovery).  See AASLD Guidance.         TIME SPENT  I spent a total time of 30 minutes face-to-face with the patient, of which more than 50% was spent counseling and coordinating care as outlined in this note.      Emmeline Winebarger L. Trula Slade, PharmD,  [facility]   Pager#: [phone]

## 2016-02-06 LAB — HEPATITIS C RNA, QUANT: Hepatitis C Quant Result: NOT DETECTED [IU]/mL

## 2016-02-07 ENCOUNTER — Encounter (HOSPITAL_BASED_OUTPATIENT_CLINIC_OR_DEPARTMENT_OTHER): Payer: Self-pay | Admitting: Pharmacist

## 2016-02-07 ENCOUNTER — Encounter (HOSPITAL_BASED_OUTPATIENT_CLINIC_OR_DEPARTMENT_OTHER): Payer: Self-pay | Admitting: Infectious Disease

## 2016-02-12 ENCOUNTER — Encounter (INDEPENDENT_AMBULATORY_CARE_PROVIDER_SITE_OTHER): Payer: Self-pay | Admitting: Family

## 2016-02-12 ENCOUNTER — Ambulatory Visit (INDEPENDENT_AMBULATORY_CARE_PROVIDER_SITE_OTHER): Payer: No Typology Code available for payment source | Admitting: Family

## 2016-02-12 VITALS — BP 160/85 | HR 54 | Temp 98.0°F | Resp 16 | Ht 72.0 in | Wt 204.0 lb

## 2016-02-12 DIAGNOSIS — I10 Essential (primary) hypertension: Secondary | ICD-10-CM

## 2016-02-12 DIAGNOSIS — Z6827 Body mass index (BMI) 27.0-27.9, adult: Secondary | ICD-10-CM

## 2016-02-12 MED ORDER — AMLODIPINE BESYLATE 2.5 MG OR TABS
2.5000 mg | ORAL_TABLET | Freq: Every day | ORAL | 0 refills | Status: DC
Start: 2016-02-12 — End: 2016-02-24

## 2016-02-12 NOTE — Progress Notes (Signed)
CHIEF Complaint(s)  Ronald LevyBrian Gordon Page is a 55 year old Male here with chief complaint(s):  Hypertension  Slowly going up over the years.  He is retiring wihtin the next 35 weeks    Blood pressure has been elevated in the office and 154/90 it is at home.  He thought it might resolve with additional changes to his lifestyle but is continuing to be high.  He is here for consultation.  He denies chest pain or shortness of breath.  He is an avid hiker and he is an avid walker with his dog.  He did smoke for 25 years but stopped many years ago.  He does use an inhaler occasionally.  Denies morning cough.  No night sweats.    Allergies-Review of patient's allergies indicates no known allergies.    Review of Systems:  Constitution: vitals signs stable, smiling, no distress, weight   Heart: No history of heart disease.  No history of murmurs.  No swelling in lower extremities.  No chest pain.  GI: He is on Hep C medication.  RNA is  Now 0.He is on Maverick and he does have side effects.  He has 3 weeks left.   Neuro: Denies migraines.  Has no history of TIAs.    Past Medical History:   Diagnosis Date   . Allergic rhinitis, cause unspecified    . Chronic obstructive asthma, unspecified (HCC)    . Hepatitis, unspecified    . Other abnormal heart sounds      No past surgical history on file.  family history includes Cancer in his maternal grandfather; Diabetes in his maternal grandfather; Heart Disease in his father.  No past surgical history on file.  Social History     Social History   . Marital status: Married     Spouse name: N/A   . Number of children: N/A   . Years of education: N/A     Occupational History   . Not on file.     Social History Main Topics   . Smoking status: Former Games developermoker   . Smokeless tobacco: Never Used   . Alcohol use Yes      Comment: occ   . Drug use: No   . Sexual activity: Not on file     Other Topics Concern   . Not on file     Social History Narrative    Work out 3 x per week    Both cardio and  strength training        Patient Active Problem List   Diagnosis   . Bradycardia   . Fatigue   . Hepatitis C   . HTN (hypertension)   . Asthma   . Hearing loss     Current Outpatient Prescriptions   Medication Sig Dispense Refill   . Albuterol Sulfate HFA (PROAIR HFA) 108 (90 Base) MCG/ACT Inhalation Aero Soln Inhale 2 puffs by mouth every 4 hours as needed. And 15 minutes prior to exercise for asthma 1 Inhaler 2   . Aspirin 81 MG Oral Tab Take 81 mg by mouth daily.     Marland Kitchen. Glecaprevir-Pibrentasvir (MAVYRET) 100-40 MG Oral Tab Take 3 tablets by mouth daily. Take with food. 84 tablet 0   . Montelukast Sodium 10 MG Oral Tab take 1 tablet by mouth every evening (Patient not taking: Reported on 02/12/2016) 90 tablet 0     No current facility-administered medications for this visit.          OBJECTIVE:  Constitution: patient appears Very healthy and younger than his stated age.  Blood pressure was 180/100 when he came in the room.  Second reading was improved.  He does not appear to be in any respiratory distress.  HEENT: Negative  Heart Regular rate and rhythm, S1 and S2 present without murmur  Lungs: Clear no rales, rhonchi, breath sounds equal.  Breasts are equal inspiration and expiration.  Extremities: No signs of vascular changes and no edema in lower extremities    Health Maintenance reviewed - reviewed, reminded and ordered.    ASSESSMENT AND PLAN    (I10) Essential hypertension  (primary encounter diagnosis)  Plan: AmLODIPine Besylate 2.5 MG Oral Tab  Likely he has hypertension from years of smoking.  May have small capillaries versus CO2 retention to some degree.  We'll start with amlodipine again and see if his blood pressure is reduced.  Ask him to also use his inhaler and consider seeing pulmonary for further PFT evaluation.  He said he would consider this before he retires.        Orders per documented in this encounter  Patient instructions per documented in this encounter          Patient Instructions      It was a pleasure to see you in clinic today.                If you are not yet signed up for eCare, please see the below eCare section at the end of this document for how to enroll and to get your access codes.  It's easy to sign up at home today.    eCare  enrollment will allow you access to the below benefits   You can make appointments online   View test results / Lab Results   Request prescription renewals   Obtain a copy of our After Visit Summary (an electronic copy of this document)     Your Test Results:  If labs were ordered today the results are expected to be available via eCare in about 5 days. If you have an active eCare account, this is how we will notify you of your results.     If you do not have an eCare account then your test results will be mailed to you within about 14 days after your tests are completed. If your physician needs to change your care based on your results or is concerned, you should expect a phone call or eCare message from your provider.    If you have any questions about your test results please schedule an appointment with your provider, so that we may review them with you in greater detail.    **If it has been more than 2 weeks and you have not received your test results please send our office a message via eCare.    Medication Refills: If you need a prescription refilled, please contact your pharmacy 1 week before your current supply will run out to request the refill.  Contacting your pharmacy is the fastest and safest way to obtain a medication refill.  The pharmacy will notify our office.  Please note, that a minimum of 48 to 72 hours is needed to refill a medication,  Please call your pharmacy early to allow enough time to refill before you anticipate running out.  For faster medication refills, you can also schedule an appointment with your provider.    We know you have a choice in where you receive your healthcare and  we sincerely thank you for trusting [facility] with your health.

## 2016-02-12 NOTE — Progress Notes (Signed)
Reviewed eCare status with Patient:  YES    HEALTH MAINTENANCE:  Has the patient had any of these since their last visit?    Cervical screening/PAP: Not Due     Mammo: Not Due    Colon Screen: Not Due    Diabetic Eye Exam: Not Due      Have you seen a specialist since your last visit: No        HM Due:   Health Maintenance   Topic Date Due   . Influenza Vaccine (1) 01/14/2016   . Diabetes Screen  12/25/2017   . Cholesterol Test  02/13/2018   . Colon Cancer Screen w/ FOBT/FIT  11/11/2019   . Tetanus Vaccine  07/21/2023   . HIV Screen  Addressed           Future Appointments  Date Time Provider Department Center   02/12/2016 11:20 AM Eliane DecreeMeyer, [name] Ellen[facility]   03/01/2016 10:40 AM Lorin PicketScott, Earl GalaJohn Douglas, MD [facility]

## 2016-02-12 NOTE — Patient Instructions (Signed)
It was a pleasure to see you in clinic today.                If you are not yet signed up for eCare, please see the below eCare section at the end of this document for how to enroll and to get your access codes.  It's easy to sign up at home today.    eCare  enrollment will allow you access to the below benefits   You can make appointments online   View test results / Lab Results   Request prescription renewals   Obtain a copy of our After Visit Summary (an electronic copy of this document)     Your Test Results:  If labs were ordered today the results are expected to be available via eCare in about 5 days. If you have an active eCare account, this is how we will notify you of your results.     If you do not have an eCare account then your test results will be mailed to you within about 14 days after your tests are completed. If your physician needs to change your care based on your results or is concerned, you should expect a phone call or eCare message from your provider.    If you have any questions about your test results please schedule an appointment with your provider, so that we may review them with you in greater detail.    **If it has been more than 2 weeks and you have not received your test results please send our office a message via eCare.    Medication Refills: If you need a prescription refilled, please contact your pharmacy 1 week before your current supply will run out to request the refill.  Contacting your pharmacy is the fastest and safest way to obtain a medication refill.  The pharmacy will notify our office.  Please note, that a minimum of 48 to 72 hours is needed to refill a medication,  Please call your pharmacy early to allow enough time to refill before you anticipate running out.  For faster medication refills, you can also schedule an appointment with your provider.    We know you have a choice in where you receive your healthcare and we sincerely thank you for trusting [facility] with your health.

## 2016-02-19 ENCOUNTER — Encounter (INDEPENDENT_AMBULATORY_CARE_PROVIDER_SITE_OTHER): Payer: Self-pay | Admitting: Family

## 2016-02-19 DIAGNOSIS — I10 Essential (primary) hypertension: Secondary | ICD-10-CM

## 2016-02-19 NOTE — Telephone Encounter (Signed)
Ronald Page please see ecare message regarding medication

## 2016-02-22 ENCOUNTER — Other Ambulatory Visit: Payer: Self-pay | Admitting: Family

## 2016-02-22 DIAGNOSIS — J4520 Mild intermittent asthma, uncomplicated: Secondary | ICD-10-CM

## 2016-02-22 MED ORDER — ALBUTEROL SULFATE HFA 108 (90 BASE) MCG/ACT IN AERS
2.0000 | INHALATION_SPRAY | RESPIRATORY_TRACT | 0 refills | Status: DC | PRN
Start: 2016-02-22 — End: 2016-09-20

## 2016-02-23 NOTE — Telephone Encounter (Signed)
Patient responded to provider message.

## 2016-02-24 MED ORDER — AMLODIPINE BESYLATE 2.5 MG OR TABS
2.5000 mg | ORAL_TABLET | Freq: Every day | ORAL | 4 refills | Status: DC
Start: 2016-02-24 — End: 2016-03-19

## 2016-02-24 NOTE — Telephone Encounter (Signed)
Helping to cover Ronald Page's inbox - responded to pt and refilled amlodipine through [facility]. Recommended f/u with [name] in a month.

## 2016-02-29 ENCOUNTER — Telehealth (HOSPITAL_BASED_OUTPATIENT_CLINIC_OR_DEPARTMENT_OTHER): Payer: Self-pay | Admitting: Infectious Disease

## 2016-02-29 NOTE — Telephone Encounter (Signed)
Incoming call from pt. Rescheduled 8 weeks f/u appt to 03/29/16 per his request. Advised patient to complete labs prior appt. [facility]

## 2016-03-01 ENCOUNTER — Encounter (HOSPITAL_BASED_OUTPATIENT_CLINIC_OR_DEPARTMENT_OTHER): Payer: No Typology Code available for payment source | Admitting: Infectious Disease

## 2016-03-11 ENCOUNTER — Other Ambulatory Visit (HOSPITAL_BASED_OUTPATIENT_CLINIC_OR_DEPARTMENT_OTHER): Payer: Self-pay | Admitting: Pharmacist

## 2016-03-11 ENCOUNTER — Other Ambulatory Visit (HOSPITAL_BASED_OUTPATIENT_CLINIC_OR_DEPARTMENT_OTHER)
Admit: 2016-03-11 | Discharge: 2016-03-11 | Disposition: A | Discharge status: Home / Self Care | Payer: No Typology Code available for payment source | Attending: Infectious Disease | Admitting: Infectious Disease

## 2016-03-11 ENCOUNTER — Other Ambulatory Visit (INDEPENDENT_AMBULATORY_CARE_PROVIDER_SITE_OTHER): Payer: No Typology Code available for payment source

## 2016-03-11 DIAGNOSIS — B182 Chronic viral hepatitis C: Secondary | ICD-10-CM

## 2016-03-11 LAB — HEPATIC FUNCTION PANEL
ALT (GPT): 28 U/L (ref 10–48)
AST (GOT): 19 U/L (ref 9–38)
Albumin: 3.8 g/dL (ref 3.5–5.2)
Alkaline Phosphatase (Total): 50 U/L (ref 37–159)
Bilirubin (Direct): 0.1 mg/dL (ref 0.0–0.3)
Bilirubin (Total): 0.3 mg/dL (ref 0.2–1.3)
Protein (Total): 6.7 g/dL (ref 6.0–8.2)

## 2016-03-11 NOTE — Progress Notes (Signed)
Courtesy blood draw  at [place].

## 2016-03-12 ENCOUNTER — Other Ambulatory Visit (INDEPENDENT_AMBULATORY_CARE_PROVIDER_SITE_OTHER): Payer: No Typology Code available for payment source

## 2016-03-14 LAB — HEPATITIS C RNA, QUANT: Hepatitis C Quant Result: NOT DETECTED [IU]/mL

## 2016-03-15 ENCOUNTER — Encounter (HOSPITAL_BASED_OUTPATIENT_CLINIC_OR_DEPARTMENT_OTHER): Payer: Self-pay | Admitting: Infectious Disease

## 2016-03-18 ENCOUNTER — Encounter (INDEPENDENT_AMBULATORY_CARE_PROVIDER_SITE_OTHER): Payer: Self-pay | Admitting: Family

## 2016-03-18 DIAGNOSIS — I10 Essential (primary) hypertension: Secondary | ICD-10-CM

## 2016-03-18 NOTE — Telephone Encounter (Signed)
Ronald Page please see ecare message and advise

## 2016-03-19 MED ORDER — AMLODIPINE BESYLATE 5 MG OR TABS
5.0000 mg | ORAL_TABLET | Freq: Every day | ORAL | 3 refills | Status: DC
Start: 2016-03-19 — End: 2017-03-18

## 2016-03-29 ENCOUNTER — Ambulatory Visit (HOSPITAL_BASED_OUTPATIENT_CLINIC_OR_DEPARTMENT_OTHER): Payer: No Typology Code available for payment source | Attending: Infectious Disease | Admitting: Infectious Disease

## 2016-03-29 VITALS — BP 153/90 | HR 73 | Temp 97.5°F | Resp 16 | Wt 211.6 lb

## 2016-03-29 DIAGNOSIS — Z6828 Body mass index (BMI) 28.0-28.9, adult: Secondary | ICD-10-CM

## 2016-03-29 DIAGNOSIS — B182 Chronic viral hepatitis C: Secondary | ICD-10-CM | POA: Insufficient documentation

## 2016-03-29 LAB — HEPATIC FUNCTION PANEL
ALT (GPT): 39 U/L (ref 10–48)
AST (GOT): 23 U/L (ref 9–38)
Albumin: 4.2 g/dL (ref 3.5–5.2)
Alkaline Phosphatase (Total): 50 U/L (ref 37–159)
Bilirubin (Direct): 0.1 mg/dL (ref 0.0–0.3)
Bilirubin (Total): 0.5 mg/dL (ref 0.2–1.3)
Protein (Total): 6.9 g/dL (ref 6.0–8.2)

## 2016-03-29 NOTE — Progress Notes (Signed)
Hepatitis and Liver Clinic Note    ID/CC:  Ronald Page is a 55 year old yo male seen at the request of Fonnie Mu, ARNP for treatment of chronic hepatitis C.    HPI:  Ronald Page was diagnosed in 1991 at time of routine blood donation, told he had hep C.  GT 1a  HCV RNA level: 115,642 IU/ml  HIV negative 10 yrs ago  Fibrosis level:F0 on liver bx 2009, F0 on Fibrosure in 2017  He started on 8 wk course of Mavyret 12 wks ago.  Had undetectable VL at 4 and 8 wks.  SEs included HAs and fatigue. Now resolved.  Also had some irritability, depression which is also better.  Denies abd pain, distention or jaundice.      Allergies:  Review of patient's allergies indicates:  No Known Allergies confirmed    PMH:  Past Medical History:   Diagnosis Date   . Allergic rhinitis, cause unspecified    . Chronic obstructive asthma, unspecified (HCC)    . Hepatitis, unspecified    . Other abnormal heart sounds     R shoulder pain  HTN    PSH:  No past surgical history on file. s/p appy in 1992, R shoulder arthroscopy    Medications:  Outpatient Medications Prior to Visit   Medication Sig Dispense Refill   . Albuterol Sulfate HFA (PROAIR HFA) 108 (90 Base) MCG/ACT Inhalation Aero Soln Inhale 2 puffs by mouth every 4 hours as needed. And 15 minutes prior to exercise for asthma 3 Inhaler 0   . AmLODIPine Besylate 5 MG Oral Tab Take 1 tablet (5 mg) by mouth daily. 90 tablet 3   . Aspirin 81 MG Oral Tab Take 81 mg by mouth daily.     Marland Kitchen Glecaprevir-Pibrentasvir (MAVYRET) 100-40 MG Oral Tab Take 3 tablets by mouth daily. Take with food. 84 tablet 0     No facility-administered medications prior to visit.     confirmed, not taking oxycodone or glucosamine    SH/FH:  Social History     Social History   . Marital status: Married     Spouse name: N/A   . Number of children: N/A   . Years of education: N/A     Occupational History   . Not on file.     Social History Main Topics   . Smoking status: Former Games developer   . Smokeless  tobacco: Never Used   . Alcohol use Yes      Comment: occ   . Drug use: No   . Sexual activity: Not on file     Other Topics Concern   . Not on file     Social History Narrative    Work out 3 x per week    Both cardio and strength training     Rare glass of wine  2 kids, wife is a TEFL teacher to hike and [employer] vintage guns     FH grandfather had early colon CA, no liver dz or cirrhosis in family      PE:  BP 153/90   Pulse 73   Temp 97.5 F (36.4 C) (Temporal)   Resp 16   Wt 211 lb 10.3 oz (96 kg)   BMI 28.70 kg/m   CONSTITUTIONAL/GENERAL:  Appearance:  Well developed, appearing stated age and in no acute distress. Weight appears normal for height.  HENT AT/NC, alopecia.  MENTAL STATUS: PSYCHIATRIC:  Judgement/insight:  Normal, Mood/affect:  Normal., Orientation:  Normal., Recent/remote memory:  Normal..    Labs:  Orders Only on 03/11/2016   Component Date Value   . Albumin 03/11/2016 3.8    . Protein (Total) 03/11/2016 6.7    . Bilirubin (Total) 03/11/2016 0.3    . Bilirubin (Direct) 03/11/2016 0.1    . Alkaline Phosphatase (To* 03/11/2016 50    . AST (GOT) 03/11/2016 19    . ALT (GPT) 03/11/2016 28    . Hepatitis C Quant Result 03/14/2016 None detected.     . Hcv RNA Iu/Ml (Log 10) 03/14/2016 Not Calculated    . Hepatitis C Quant Interp 03/14/2016                      Value:The limit of quantitative detection (the minimum virus level that gives a positive result in 95% of replicates) is 12 IU/mL (1.08 log IU/mL). Quantitative results less than 12 IU/mL are described as   very low positive.  The clinical significance of very low positive results is uncertain and should be individualized.     Orders Only on 02/02/2016   Component Date Value   . Albumin 02/02/2016 3.9    . Protein (Total) 02/02/2016 7.0    . Bilirubin (Total) 02/02/2016 0.5    . Bilirubin (Direct) 02/02/2016 0.2    . Alkaline Phosphatase (To* 02/02/2016 53    . AST (GOT) 02/02/2016 14    . ALT (GPT) 02/02/2016 18    . Hepatitis C Quant Result  02/06/2016 None detected.     . Hcv RNA Iu/Ml (Log 10) 02/06/2016 Not Calculated    . Hepatitis C Quant Interp 02/06/2016                      Value:The limit of quantitative detection (the minimum virus level that gives a positive result in 95% of replicates) is 12 IU/mL (1.08 log IU/mL). Quantitative results less than 12 IU/mL are described as   very low positive.  The clinical significance of very low positive results is uncertain and should be individualized.     Medication Management on 01/03/2016   Component Date Value   . Hepatitis A IgG Antibody 02/02/2016 Nonreactive    . Hep A IgG Interpretation 02/02/2016 No evidence of past Hepatitis A infection or immunization. If recent Hepatitis A infection is suspected, order Hepatitis A IgM test.    . Hepatitis B Core Ab 02/02/2016 Nonreactive    . Hepatitis B Core Antibod* 02/02/2016 Antibody to the Hepatitis B core antigen (anti-HBc) was not detected.    . Hepatitis B S Ag w/Rflx * 02/02/2016 Nonreactive    . Hepatitis B Surface Ab 02/02/2016 Nonreactive    . Hepatitis B Surf Antibod* 02/02/2016 <8.00    . Hepatitis B Surf Ag and * 02/02/2016 No evidence of recent or resolved Hepatitis B infection. If Hepatitis B infection suspected in the past 6 to 9 months, consider Hepatitis B core IgG and/or Hepatitis B core IgM testing.        Office Visit on 12/26/2014   Component Date Value   . Hepatitis C Quant Result 12/26/2014 [id number]*   . Hcv RNA Iu/Ml (Log 10) 12/26/2014 5.1    . Hepatitis C Quant Interp 12/26/2014                      Value:The limit of quantitative detection (the minimum virus level that gives a positive result in 95% of replicates) is 12 IU/mL (  1.08 log IU/mL). Quantitative results less than 12 IU/mL are described as   very low positive.  The clinical significance of very low positive results is uncertain and should be individualized.     . Sodium 12/26/2014 138    . Potassium 12/26/2014 4.0    . Chloride 12/26/2014 103    . Carbon Dioxide, Total  12/26/2014 31    . Anion Gap 12/26/2014 4    . Glucose 12/26/2014 111    . Urea Nitrogen 12/26/2014 19    . Creatinine 12/26/2014 1.07    . Protein (Total) 12/26/2014 7.0    . Albumin 12/26/2014 4.3    . Bilirubin (Total) 12/26/2014 0.3    . Calcium 12/26/2014 9.4    . AST (GOT) 12/26/2014 34    . Alkaline Phosphatase (To* 12/26/2014 47    . ALT (GPT) 12/26/2014 69*   . GFR, Calc, European Amer* 12/26/2014 >60    . GFR, Calc, African Ameri* 12/26/2014 >60    . GFR, Information 12/26/2014 Calculated GFR in mL/min/1.73 m2 by MDRD equation.  Inaccurate with changing renal function.  See [email or web address]    . WBC 12/26/2014 8.14    . RBC 12/26/2014 4.60    . Hemoglobin 12/26/2014 14.8    . Hematocrit 12/26/2014 44    . MCV 12/26/2014 95    . [facility] 12/26/2014 32.2    . MCHC 12/26/2014 33.7    . Platelet Count 12/26/2014 253    . RDW-CV 12/26/2014 12.3    . % Neutrophils 12/26/2014 58    . % Lymphocytes 12/26/2014 28    . % Monocytes 12/26/2014 10    . % Eosinophils 12/26/2014 3    . % Basophils 12/26/2014 1    . % Immature Granulocytes 12/26/2014 0    . Neutrophils 12/26/2014 4.82    . Absolute Lymphocyte Count 12/26/2014 2.24    . Monocytes 12/26/2014 0.77    . Absolute Eosinophil Count 12/26/2014 0.23    . Basophils 12/26/2014 0.06    . Immature Granulocytes 12/26/2014 0.02    . Nucleated RBC 12/26/2014 0.00    . % Nucleated RBC 12/26/2014 0    . Prostate Specific Antigen 12/26/2014 0.97        Studies:  Liver Bx 2009: stage 0, grade 1  U/s 2009: Impression: No focal mass on ultrasound. Biliary sludge. Marked for   biopsy.     Impression/Plan:  1) Chronic Hepatitis C.  Ronald LevyBrian Gordon Lipsky is a 55 year old white man w/ chronic hep C, GT 1a, low viral load and early fibrosis in 2009 who most likely acquired HCV 25 yrs ago from either INC or tattoos.  He has good synthetic function with a recent low APRI (0.354) as well as low Fibrosure. Has completed 8 wks of G/P w/ good viral  response.    -check LFts, VL today.  If negative, very good chance of cure but needs same labs in 2 mos to be sure.  If relapses, could try Vosevi.  -Hep A/B vaccine series    RTC prn, based on above labs.  If relapse, I'd need to see again.  O/w, no f/u required.    I spent a total time of 15 minutes face-to-face with the patient, of which more than 50% was spent counseling and coordinating care as outlined in this note.

## 2016-03-30 LAB — HEPATITIS C RNA, QUANT: Hepatitis C Quant Result: NOT DETECTED [IU]/mL

## 2016-04-01 ENCOUNTER — Encounter (HOSPITAL_BASED_OUTPATIENT_CLINIC_OR_DEPARTMENT_OTHER): Payer: Self-pay | Admitting: Infectious Disease

## 2016-04-01 ENCOUNTER — Other Ambulatory Visit: Payer: Self-pay

## 2016-05-04 ENCOUNTER — Ambulatory Visit (INDEPENDENT_AMBULATORY_CARE_PROVIDER_SITE_OTHER): Payer: No Typology Code available for payment source | Admitting: Family

## 2016-05-15 ENCOUNTER — Other Ambulatory Visit: Payer: Self-pay | Admitting: Family

## 2016-05-15 DIAGNOSIS — J4531 Mild persistent asthma with (acute) exacerbation: Secondary | ICD-10-CM

## 2016-05-15 NOTE — Telephone Encounter (Signed)
Montelukast was discontinued on 02/12/16  OK to restart?  Defer to PCP

## 2016-05-17 MED ORDER — MONTELUKAST SODIUM 10 MG OR TABS
ORAL_TABLET | ORAL | 3 refills | Status: DC
Start: 2016-05-17 — End: 2016-12-22

## 2016-05-28 ENCOUNTER — Ambulatory Visit (INDEPENDENT_AMBULATORY_CARE_PROVIDER_SITE_OTHER): Payer: No Typology Code available for payment source | Admitting: Family

## 2016-05-28 VITALS — BP 143/89 | HR 73 | Temp 97.4°F | Resp 16 | Ht 72.0 in | Wt 212.0 lb

## 2016-05-28 DIAGNOSIS — Z6828 Body mass index (BMI) 28.0-28.9, adult: Secondary | ICD-10-CM

## 2016-05-28 DIAGNOSIS — Z0000 Encounter for general adult medical examination without abnormal findings: Secondary | ICD-10-CM

## 2016-05-28 DIAGNOSIS — B182 Chronic viral hepatitis C: Secondary | ICD-10-CM

## 2016-05-28 DIAGNOSIS — Z23 Encounter for immunization: Secondary | ICD-10-CM

## 2016-05-28 LAB — COMPREHENSIVE METABOLIC PANEL
ALT (GPT): 38 U/L (ref 10–48)
AST (GOT): 22 U/L (ref 9–38)
Albumin: 4.3 g/dL (ref 3.5–5.2)
Alkaline Phosphatase (Total): 49 U/L (ref 37–159)
Anion Gap: 10 (ref 4–12)
Bilirubin (Total): 0.3 mg/dL (ref 0.2–1.3)
Calcium: 9.3 mg/dL (ref 8.9–10.2)
Carbon Dioxide, Total: 26 meq/L (ref 22–32)
Chloride: 103 meq/L (ref 98–108)
Creatinine: 0.84 mg/dL (ref 0.51–1.18)
GFR, Calc, African American: >60 mL/min/{1.73_m2} (ref >59)
GFR, Calc, European American: >60 mL/min/{1.73_m2} (ref >59)
Glucose: 96 mg/dL (ref 62–125)
Potassium: 4.1 meq/L (ref 3.6–5.2)
Protein (Total): 7 g/dL (ref 6.0–8.2)
Sodium: 139 meq/L (ref 135–145)
Urea Nitrogen: 14 mg/dL (ref 8–21)

## 2016-05-28 LAB — LIPID PANEL
Cholesterol (LDL): 125 mg/dL (ref <130)
Cholesterol/HDL Ratio: 3.1
HDL Cholesterol: 69 mg/dL (ref >39)
Non-HDL Cholesterol: 146 mg/dL (ref 0–159)
Total Cholesterol: 215 mg/dL — ABNORMAL HIGH (ref <200)
Triglyceride: 107 mg/dL (ref <150)

## 2016-05-28 NOTE — Patient Instructions (Signed)
It was a pleasure to see you in clinic today.                If you are not yet signed up for eCare, please see the below eCare section at the end of this document for how to enroll and to get your access codes.  It's easy to sign up at home today.    eCare  enrollment will allow you access to the below benefits   You can make appointments online   View test results / Lab Results   Request prescription renewals   Obtain a copy of our After Visit Summary (an electronic copy of this document)     Your Test Results:  If labs were ordered today the results are expected to be available via eCare in about 5 days. If you have an active eCare account, this is how we will notify you of your results.     If you do not have an eCare account then your test results will be mailed to you within about 14 days after your tests are completed. If your physician needs to change your care based on your results or is concerned, you should expect a phone call or eCare message from your provider.    If you have any questions about your test results please schedule an appointment with your provider, so that we may review them with you in greater detail.    **If it has been more than 2 weeks and you have not received your test results please send our office a message via eCare.    Medication Refills: If you need a prescription refilled, please contact your pharmacy 1 week before your current supply will run out to request the refill.  Contacting your pharmacy is the fastest and safest way to obtain a medication refill.  The pharmacy will notify our office.  Please note, that a minimum of 48 to 72 hours is needed to refill a medication,  Please call your pharmacy early to allow enough time to refill before you anticipate running out.  For faster medication refills, you can also schedule an appointment with your provider.    We know you have a choice in where you receive your healthcare and we sincerely thank you for trusting [facility] with your health.

## 2016-05-28 NOTE — Progress Notes (Signed)
Chief Complaint(s): Final blood draw after completing Hepatitis C medication therapy. Feels great. No complaints.    Wellness     Retiring within the next 18 months. Moving to [place].    He completed his blood work with [facility]  Glucose: 102  Cholesterol (total):  178, goal is <200                   Triglycerides   :  54 goal is <150                     Cholesterol (HDL)  : 60 goal is >40                        Cholesterol (LDL)  :  *108 goal is <130  Overall 2   24.7%    ROS:   Constitution: sleeps well.   HEENT: no changes in vision or hearing. He has been diagnosed with hearing loss in the past. Not wearing hearing aides.  Lungs:Sometimes wet lung in the morning but taking his singular and albuterol.   Cardiac: B/p runs 140/85       he is hiking without shortness of breath  GI: no problems identified  Skin: no problems identified  Neuro: no problems identified  Endo: No symptoms of diabetes or thyroid dysfunction  GU: Family hx of Prostate   MSK: No problems identified, general aches and pain when working out  Mental: no problems identified    Patient Active Problem List   Diagnosis   . Bradycardia   . Fatigue   . Hepatitis C   . HTN (hypertension)   . Asthma   . Hearing loss     Outpatient Medications Prior to Visit   Medication Sig Dispense Refill   . Albuterol Sulfate HFA (PROAIR HFA) 108 (90 Base) MCG/ACT Inhalation Aero Soln Inhale 2 puffs by mouth every 4 hours as needed. And 15 minutes prior to exercise for asthma 3 Inhaler 0   . AmLODIPine Besylate 5 MG Oral Tab Take 1 tablet (5 mg) by mouth daily. 90 tablet 3   . Aspirin 81 MG Oral Tab Take 81 mg by mouth daily.     Marland Kitchen Glecaprevir-Pibrentasvir (MAVYRET) 100-40 MG Oral Tab Take 3 tablets by mouth daily. Take with food. 84 tablet 0   . Montelukast Sodium 10 MG Oral Tab TAKE 1 TABLET EVERY EVENING 90 tablet 3     No facility-administered medications prior to visit.        I personally reviewed and confirmed the ROS, past medical history, past surgical  history, family history and social history in the record with the patient today.    Health maintenance reviewed by MA  There are no preventive care reminders to display for this patient.        OBJECTIVE FINDINGS:  Physical Exam   Constitutional: He is oriented to person, place, and time. He appears well-nourished. No distress.   HENT:   Head: Normocephalic.   Eyes: Conjunctivae and EOM are normal. Pupils are equal, round, and reactive to light.   Neck: Normal range of motion. Neck supple. No thyromegaly present.   Cardiovascular: Normal rate, regular rhythm and normal heart sounds.    No murmur heard.  Pulmonary/Chest: Effort normal and breath sounds normal.   Abdominal: Soft. Bowel sounds are normal.   Musculoskeletal: Normal range of motion.   Neurological: He is alert and oriented to person, place, and time. He  has normal reflexes. No cranial nerve deficit.   Skin: Skin is warm and dry.   Psychiatric: He has a normal mood and affect.       ASSESSMENT AND PLAN:      (B18.2) Chronic hepatitis C without hepatic coma (HCC)  (primary encounter diagnosis)  Plan: Hepatitis B Core IgM Ab, HEPATITIS C RNA         GENOTYPING, HEPATITIS C RNA, QUANT, CANCELED:         Hepatitis C Ab with REFLEX PCR            (Z00.00) Encounter for general adult medical examination without abnormal findings  Plan: COMPREHENSIVE METABOLIC PANEL, LIPID PANEL          (Z23) Need for vaccination  Plan: pneumococcal (Pneumovax 23) polysaccharide vacc        (adult) 0.5 mL IM or SUBCUTANEOUS - [address]- [id number]  He has life history of asthma

## 2016-05-28 NOTE — Progress Notes (Signed)
Reviewed eCare status with Patient:  YES    HEALTH MAINTENANCE:  Has the patient had any of these since their last visit?    Cervical screening/PAP: Not Due     Mammo: Not Due    Colon Screen: Not Due    Diabetic Eye Exam: Not Due    Have you seen a specialist since your last visit: No    HM Due:   Health Maintenance   Topic Date Due   . Diabetes Screening  12/25/2017   . Lipid Disorders Screening  02/13/2018   . Colorectal Cancer Screening (FOBT/FIT)  11/11/2019   . Tetanus Vaccine  07/21/2023   . Influenza Vaccine  Completed   . HIV Screening  Addressed     No future appointments.    Vaccine Screening Questions    1. Are you allergic to Latex? NO    2.  Have you had a serious reaction or an allergic reaction to a vaccine?  NO    3.  Currently have a moderate or severe illness, including fever? (Don't Ask if vaccine   ordered by provider same day)  NO    4.  Ever had a seizure or a brain or other nervous system problem syndrome associated with a vaccine? (DTaP/TDaP/DTP pertinent) NO    5.  Is patient receiving  any live vaccinations today? (Varicella-Chickenpox, MMR-Measles/Mumps/Rubella, Zoster-Shingles, Flumist, Yellow Fever) NOTE: oral rotavirus is exempt  NO    If YES to any of the questions above - Do NOT give vaccine.  Consult with RN or provider in clinic.  (#4 can be YES if all Live vaccine questions are answered NO)    If NO to all questions above - Patient may receive vaccine.    6.  Do you need to receive the Flu vaccine today? NO    Vaccine information sheet(s) discussed, patient/parent/guardian verbalized understanding? YES     VIS given 05/28/2016 by Minda Ditto MA.      Vaccine given today without initial adverse effect. YES    Minda Ditto, MA     Patient is getting the PCV23 due to chronic asthma OK per Nelva Bush.

## 2016-05-30 LAB — HEPATITIS C RNA, QUANT: Hepatitis C Quant Result: NOT DETECTED [IU]/mL

## 2016-05-30 LAB — HEPATITIS B CORE IGM AB: Hepatitis B Core IgM Ab: NEGATIVE

## 2016-05-31 NOTE — Progress Notes (Signed)
Your lab results are normal.   Please let me know if you have any question.

## 2016-06-04 ENCOUNTER — Ambulatory Visit (INDEPENDENT_AMBULATORY_CARE_PROVIDER_SITE_OTHER): Payer: No Typology Code available for payment source | Admitting: Family

## 2016-06-05 LAB — LIVER FIBROSIS, FIBRO/ACTITEST
Liver Fibr. ALT: 59 U/L — ABNORMAL HIGH (ref 9–46)
Liver Fibr. Alpha-2-Macroglob: 203 mg/dL (ref 106–279)
Liver Fibr. Apolipoprotein A-1: 184 mg/dL — ABNORMAL HIGH (ref 94–176)
Liver Fibr. Fibrosis Score: 0.27
Liver Fibr. GGT: 79 U/L (ref 3–85)
Liver Fibr. Haptoglobulin: 92 mg/dL (ref 43–212)
Liver Fibr. Necroinf Act Score: 0.34
Liver Fibr. Reference ID: 1544278
Liver Fibr. Total Bilirubin: 0.4 mg/dL (ref 0.2–1.2)

## 2016-06-06 LAB — HEPATITIS C RNA GENOTYPING: HCV RNA Genotype Result: NEGATIVE

## 2016-06-09 NOTE — Progress Notes (Signed)
Your lab results is normal. Happy days!  Please let me know if you have any question.

## 2016-06-13 ENCOUNTER — Ambulatory Visit (HOSPITAL_BASED_OUTPATIENT_CLINIC_OR_DEPARTMENT_OTHER): Payer: Self-pay | Admitting: Pharmacist

## 2016-06-13 NOTE — Progress Notes (Signed)
Pt has completed HCV treatment with Mavyret x 8  weeks on 03/01/16.   Will discontinue Mavyret from medication profile.            Prescribed Regimen:   Mavyret (Glecaprevir/Pibrentasvir) 100mg /40mg  - 3 tabs PO daily, Disp QTY: 84, Refill: 1   Duration of Therapy: 8 weeks   Start Date: 01/05/16   Estimated End Date:03/01/16          Donnica Jarnagin L. Trula SladePitney, PharmD, [facility] CenterBCACP  Clinical Pharmacist   [facility]   Pager#: [phone]

## 2016-06-20 ENCOUNTER — Telehealth (HOSPITAL_BASED_OUTPATIENT_CLINIC_OR_DEPARTMENT_OTHER): Payer: Self-pay | Admitting: Infectious Disease

## 2016-06-20 NOTE — Telephone Encounter (Signed)
Left patient a voice message @[phone] to call [phone] to schedule follow up visit with Dr Lorin PicketScott. Mailed out recall reminder letter.

## 2016-09-20 ENCOUNTER — Other Ambulatory Visit: Payer: Self-pay | Admitting: Family

## 2016-09-20 DIAGNOSIS — J4520 Mild intermittent asthma, uncomplicated: Secondary | ICD-10-CM

## 2016-09-23 MED ORDER — PROAIR HFA 108 (90 BASE) MCG/ACT IN AERS
INHALATION_SPRAY | RESPIRATORY_TRACT | 1 refills | Status: DC
Start: 2016-09-23 — End: 2017-03-02

## 2016-12-04 ENCOUNTER — Encounter (INDEPENDENT_AMBULATORY_CARE_PROVIDER_SITE_OTHER): Payer: Self-pay | Admitting: Family

## 2016-12-18 ENCOUNTER — Ambulatory Visit (INDEPENDENT_AMBULATORY_CARE_PROVIDER_SITE_OTHER): Payer: No Typology Code available for payment source | Admitting: Family

## 2016-12-18 ENCOUNTER — Encounter (INDEPENDENT_AMBULATORY_CARE_PROVIDER_SITE_OTHER): Payer: Self-pay | Admitting: Family

## 2016-12-18 DIAGNOSIS — Z6828 Body mass index (BMI) 28.0-28.9, adult: Secondary | ICD-10-CM

## 2016-12-18 DIAGNOSIS — B182 Chronic viral hepatitis C: Secondary | ICD-10-CM

## 2016-12-18 LAB — HEPATIC FUNCTION PANEL
ALT (GPT): 27 U/L (ref 10–48)
AST (GOT): 21 U/L (ref 9–38)
Albumin: 4.5 g/dL (ref 3.5–5.2)
Alkaline Phosphatase (Total): 50 U/L (ref 37–159)
Bilirubin (Direct): 0.1 mg/dL (ref 0.0–0.3)
Bilirubin (Total): 0.5 mg/dL (ref 0.2–1.3)
Protein (Total): 7.1 g/dL (ref 6.0–8.2)

## 2016-12-18 NOTE — Progress Notes (Signed)
Reviewed eCare status with Patient:  YES    HEALTH MAINTENANCE:  Has the patient had any of these completed since their last visit?    Cervical screening/PAP: Not Due     Mammo: Not Due    Colon Screen: Not Due    Diabetic Eye Exam: Not Due    Immunizations Due: Not Due    Have you seen a specialist since your last visit: No    Patient accompanied by Self for this visit.    HM Due:   Health Maintenance   Topic Date Due    Depression Screening (PHQ-2)  08/30/2015    Influenza Vaccine (1) 01/13/2017    Diabetes Screening  05/29/2019    Colorectal Cancer Screening (FOBT/FIT)  11/11/2019    Lipid Disorders Screening  05/28/2021    Tetanus Vaccine  07/21/2023    HIV Screening  Addressed         Future Appointments  Date Time Provider Department Center   12/18/2016 10:40 AM Daphane ShepherdMeyer, Delcie RochKerry Ellen, ARNP [facility]

## 2016-12-18 NOTE — Progress Notes (Signed)
CHIEF Complaint(s)  Ronald Page is a 56 year old Male here with chief complaint(s): Follow up Hepatitis C only recheck. No symptoms of concern including no skin color changes, no bloating, no nausea or abdominal pain. Has been free of Hepatitis C for 9 mo post treatment. Moving to [facility]      Allergies-Patient has no known allergies.    Review of Systems:  Constitution: vitals signs stable, smiling, no distress, weight   Lungs: had some problem with the smoke.   Asthma controlled.     Past Medical History:   Diagnosis Date    Allergic rhinitis, cause unspecified     Chronic obstructive asthma, unspecified (HCC)     Hepatitis, unspecified     Other abnormal heart sounds      No past surgical history on file.  family history includes Cancer in his maternal grandfather; Diabetes in his maternal grandfather; Heart Disease in his father.  No past surgical history on file.  Social History     Social History    Marital status: Married     Spouse name: N/A    Number of children: N/A    Years of education: N/A     Occupational History    Not on file.     Social History Main Topics    Smoking status: Former Smoker    Smokeless tobacco: Never Used    Alcohol use Yes      Comment: occ    Drug use: No    Sexual activity: Not on file     Other Topics Concern    Not on file     Social History Narrative    Work out 3 x per week    Both cardio and strength training        Patient Active Problem List   Diagnosis    Bradycardia    Fatigue    Hepatitis C    HTN (hypertension)    Asthma    Hearing loss     Current Outpatient Prescriptions   Medication Sig Dispense Refill    AmLODIPine Besylate 5 MG Oral Tab Take 1 tablet (5 mg) by mouth daily. 90 tablet 3    Aspirin 81 MG Oral Tab Take 81 mg by mouth daily.      Montelukast Sodium 10 MG Oral Tab TAKE 1 TABLET EVERY EVENING (Patient not taking: Reported on 12/18/2016) 90 tablet 3    PROAIR HFA 108 (90 Base) MCG/ACT Inhalation Aero Soln USE 2 INHALATIONS  EVERY 4 HOURS AS NEEDED AND 15 MINUTES PRIOR TO EXERCISE FOR ASTHMA 25.5 g 1     No current facility-administered medications for this visit.          OBJECTIVE:  Constitution: patient appears well with stable vital signs  Heart Regular rate and rhythm, S1 and S2 present without murmur  Lungs: Clear no rales, rhonchi, breath sounds equal  Abdomen is soft, no masses or distention and no pain      Health Maintenance reviewed - reviewed, reminded and ordered.    ASSESSMENT AND PLAN    (B18.2) Chronic hepatitis C without hepatic coma (HCC)  Plan: HEPATIC FUNCTION PANEL, HEPATITIS C RNA, QUANT     Redraw blood work and only follow up if abnormal. Moving to [place]  '      Orders per documented in this encounter  Patient instructions per documented in this encounter          Patient Instructions  It was a pleasure to see you in clinic today.                If you are not yet signed up for eCare, please see the below eCare section at the end of this document for how to enroll and to get your access codes.  It's easy to sign up at home today.    eCare  enrollment will allow you access to the below benefits   You can make appointments online   View test results / Lab Results   Request prescription renewals   Obtain a copy of our After Visit Summary (an electronic copy of this document)     Your Test Results:  If labs were ordered today the results are expected to be available via eCare in about 5 days. If you have an active eCare account, this is how we will notify you of your results.     If you do not have an eCare account then your test results will be mailed to you within about 14 days after your tests are completed. If your physician needs to change your care based on your results or is concerned, you should expect a phone call or eCare message from your provider.    If you have any questions about your test results please schedule an appointment with your provider, so that we may review them with you in greater  detail.    **If it has been more than 2 weeks and you have not received your test results please send our office a message via eCare.    Medication Refills: If you need a prescription refilled, please contact your pharmacy 1 week before your current supply will run out to request the refill.  Contacting your pharmacy is the fastest and safest way to obtain a medication refill.  The pharmacy will notify our office.  Please note, that a minimum of 48 to 72 hours is needed to refill a medication,  Please call your pharmacy early to allow enough time to refill before you anticipate running out.  For faster medication refills, you can also schedule an appointment with your provider.    We know you have a choice in where you receive your healthcare and we sincerely thank you for trusting [facility] with your health.

## 2016-12-18 NOTE — Patient Instructions (Signed)
It was a pleasure to see you in clinic today.                If you are not yet signed up for eCare, please see the below eCare section at the end of this document for how to enroll and to get your access codes.  It's easy to sign up at home today.    eCare  enrollment will allow you access to the below benefits   You can make appointments online   View test results / Lab Results   Request prescription renewals   Obtain a copy of our After Visit Summary (an electronic copy of this document)     Your Test Results:  If labs were ordered today the results are expected to be available via eCare in about 5 days. If you have an active eCare account, this is how we will notify you of your results.     If you do not have an eCare account then your test results will be mailed to you within about 14 days after your tests are completed. If your physician needs to change your care based on your results or is concerned, you should expect a phone call or eCare message from your provider.    If you have any questions about your test results please schedule an appointment with your provider, so that we may review them with you in greater detail.    **If it has been more than 2 weeks and you have not received your test results please send our office a message via eCare.    Medication Refills: If you need a prescription refilled, please contact your pharmacy 1 week before your current supply will run out to request the refill.  Contacting your pharmacy is the fastest and safest way to obtain a medication refill.  The pharmacy will notify our office.  Please note, that a minimum of 48 to 72 hours is needed to refill a medication,  Please call your pharmacy early to allow enough time to refill before you anticipate running out.  For faster medication refills, you can also schedule an appointment with your provider.    We know you have a choice in where you receive your healthcare and we sincerely thank you for trusting [facility] with your health.

## 2016-12-23 ENCOUNTER — Encounter (HOSPITAL_BASED_OUTPATIENT_CLINIC_OR_DEPARTMENT_OTHER): Payer: Self-pay | Admitting: Infectious Disease

## 2016-12-23 LAB — HEPATITIS C RNA, QUANT: Hepatitis C Quant Result: NOT DETECTED [IU]/mL

## 2017-02-13 ENCOUNTER — Other Ambulatory Visit: Payer: Self-pay

## 2017-03-02 ENCOUNTER — Other Ambulatory Visit: Payer: Self-pay | Admitting: Family

## 2017-03-02 DIAGNOSIS — J4520 Mild intermittent asthma, uncomplicated: Secondary | ICD-10-CM

## 2017-03-03 MED ORDER — ALBUTEROL SULFATE HFA 108 (90 BASE) MCG/ACT IN AERS
2.0000 | INHALATION_SPRAY | RESPIRATORY_TRACT | 1 refills | Status: DC | PRN
Start: 2017-03-03 — End: 2017-05-05

## 2017-03-18 ENCOUNTER — Other Ambulatory Visit (INDEPENDENT_AMBULATORY_CARE_PROVIDER_SITE_OTHER): Payer: Self-pay | Admitting: Family

## 2017-03-18 DIAGNOSIS — I10 Essential (primary) hypertension: Secondary | ICD-10-CM

## 2017-03-19 MED ORDER — AMLODIPINE BESYLATE 5 MG OR TABS
5.0000 mg | ORAL_TABLET | Freq: Every day | ORAL | 3 refills | Status: AC
Start: 2017-03-19 — End: ?

## 2017-05-05 ENCOUNTER — Other Ambulatory Visit: Payer: Self-pay | Admitting: Family

## 2017-05-05 DIAGNOSIS — J4520 Mild intermittent asthma, uncomplicated: Secondary | ICD-10-CM

## 2017-05-06 MED ORDER — ALBUTEROL SULFATE HFA 108 (90 BASE) MCG/ACT IN AERS
2.0000 | INHALATION_SPRAY | RESPIRATORY_TRACT | 0 refills | Status: DC | PRN
Start: 2017-05-06 — End: 2017-05-09

## 2017-05-06 NOTE — Telephone Encounter (Signed)
Albuterol refilled. However has filled 9 inhalers since June along with these 3 today. Will send FYI to provider. Only inhaler currently on.

## 2017-05-09 ENCOUNTER — Other Ambulatory Visit: Payer: Self-pay | Admitting: Family

## 2017-05-09 DIAGNOSIS — J4520 Mild intermittent asthma, uncomplicated: Secondary | ICD-10-CM

## 2017-05-10 MED ORDER — ALBUTEROL SULFATE HFA 108 (90 BASE) MCG/ACT IN AERS
2.0000 | INHALATION_SPRAY | RESPIRATORY_TRACT | 0 refills | Status: DC | PRN
Start: 2017-05-10 — End: 2017-11-07

## 2017-08-05 NOTE — Progress Notes (Signed)
Department: Population Health Management  Reason: Health Maintenance    Patient's chart was reviewed by Population Coordinator  for possible lung cancer screening needs on 08/05/17    Findings:    Lung Cancer Screening  - Low risk for lung cancer per chart pt quit 1996

## 2017-08-05 NOTE — Progress Notes (Signed)
Department: Population Health Management  Reason: Health Maintenance    Patient's chart was reviewed by Population Coordinator  for possible lung cancer screening needs on 08/05/17    Findings:    Lung Cancer Screening  - Low risk for lung cancer per chart quit 1996

## 2017-11-07 ENCOUNTER — Other Ambulatory Visit (INDEPENDENT_AMBULATORY_CARE_PROVIDER_SITE_OTHER): Payer: Self-pay | Admitting: Family

## 2017-11-07 DIAGNOSIS — J4520 Mild intermittent asthma, uncomplicated: Secondary | ICD-10-CM

## 2017-11-07 MED ORDER — PROAIR HFA 108 (90 BASE) MCG/ACT IN AERS
INHALATION_SPRAY | RESPIRATORY_TRACT | 0 refills | Status: DC
Start: 2017-11-07 — End: 2018-05-08

## 2018-03-07 ENCOUNTER — Other Ambulatory Visit: Payer: Self-pay

## 2018-05-08 ENCOUNTER — Other Ambulatory Visit (INDEPENDENT_AMBULATORY_CARE_PROVIDER_SITE_OTHER): Payer: Self-pay | Admitting: Family

## 2018-05-08 DIAGNOSIS — J4520 Mild intermittent asthma, uncomplicated: Secondary | ICD-10-CM

## 2018-05-08 MED ORDER — PROAIR HFA 108 (90 BASE) MCG/ACT IN AERS
INHALATION_SPRAY | RESPIRATORY_TRACT | 3 refills | Status: AC
Start: 2018-05-08 — End: ?

## 2018-05-08 NOTE — Telephone Encounter (Signed)
The requested medication requires your authorization because it is outside of the Refill Authorization Center's protocols:  Proair    Last visit over a year ago on 12/18/16

## 2019-01-31 ENCOUNTER — Other Ambulatory Visit: Payer: Self-pay

## 2019-07-09 ENCOUNTER — Encounter (HOSPITAL_COMMUNITY): Payer: Self-pay

## 2019-07-10 NOTE — Historical Transplant Communications (Signed)
Ronald Page, [name] [id number])  Transplant Communications  ____________________________________________________________________________________    Date: 01/24/2015  Type: Referral  Entry By: Elisabeth Pigeon  Processed 3rd liver referral, patient notified and confirmation sent.  ____________________________________________________________________________________    Date: 11/24/2012  Type: Action  Entry By: [email or web address]  Appt sched for 12/28/12 at 2:15pm in [facility].  ____________________________________________________________________________________    Date: 11/23/2012  Type: Action  Entry By: Kathrin Penner to sched New Hep appt. Letter mailed.  ____________________________________________________________________________________    Date: 11/20/2012  Type: Action  Entry By: Elisabeth Pigeon  Delivered to [facility].  ____________________________________________________________________________________    Date: 11/20/2012  Type: Referral  Entry By: Elisabeth Pigeon  Processed 2nd liver referral, patient notified and confirmation sent.  ____________________________________________________________________________________    Date: 11/02/2007  Type: Action  Entry By: [email or web address]  Appt scheduled for 11/23/07 at 2:15pm in [facility].  ____________________________________________________________________________________    Date: 10/29/2007  Type: Action  Entry By: Jolayne Panther  Lmtrc to sched Hep appt.  ____________________________________________________________________________________    Date: 10/27/2007  Type: Referral  Entry By: [email or web address]  Delivered to [facility].  ____________________________________________________________________________________    Date: 10/12/2007  Type: Referral  Entry By: [email or web address]  Processed first liver referral; notification sent.

## 2022-05-28 IMAGING — MR MRI TSPINE WO CONTRAST
4 series · 17 of 48 positions shown · non-contrast
Comparison: None at all

Images Obtained from [facility]
HISTORY: 61 years-old Male with Pain in thoracic spine.
TECHNIQUE: MRI study of the thoracic spine was performed using sagittal and axial images of varying sequences. The examination is performed without contrast.

[Series 16: t2_tse_sag · sagittal · 3.0mm · 0.61mm/px · 6 of 18 slices shown]
[im 1/18]
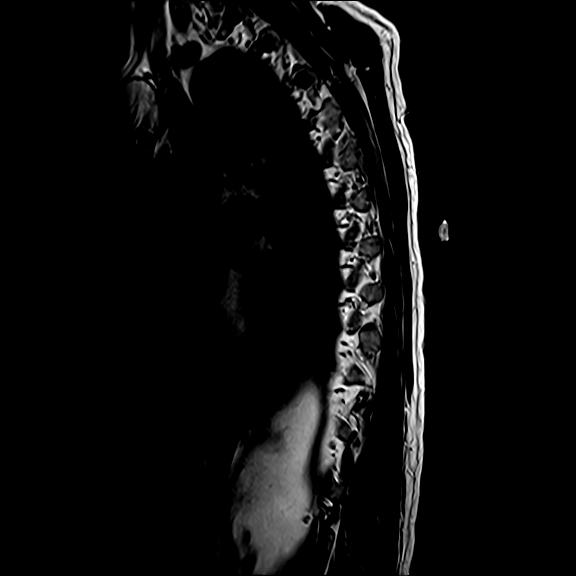
[im 4/18]
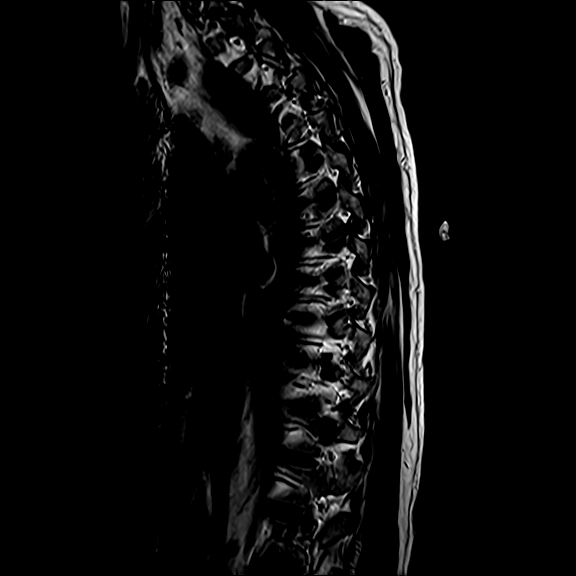
[im 7/18]
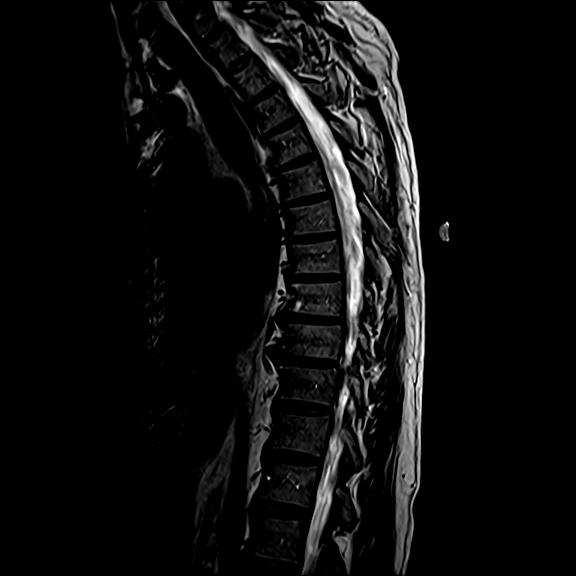
[im 11/18]
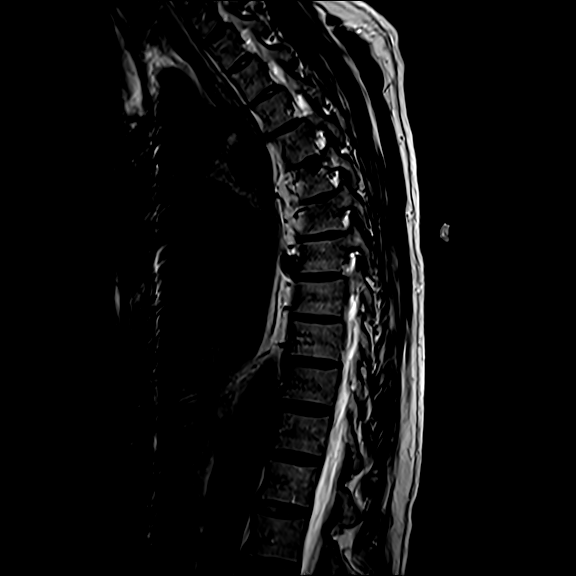
[im 14/18]
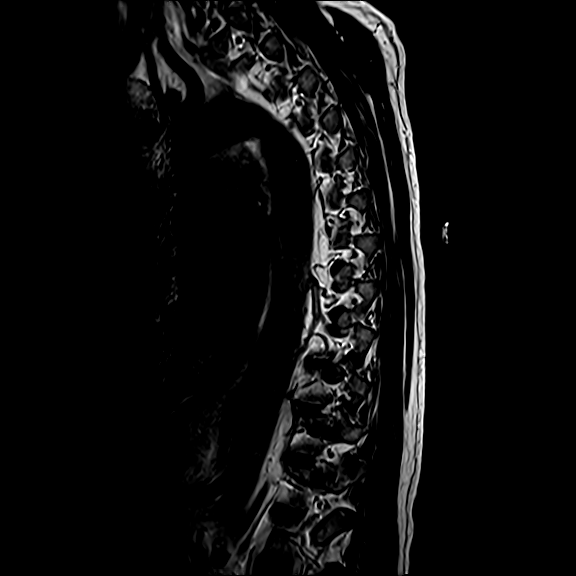
[im 18/18]
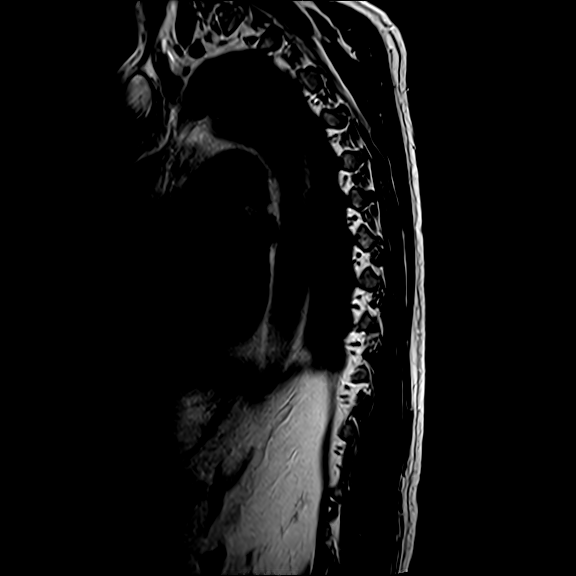

[Series 17: t1_sag · sagittal · 3.0mm · 0.61mm/px · 5 of 18 slices shown]
[im 1/18]
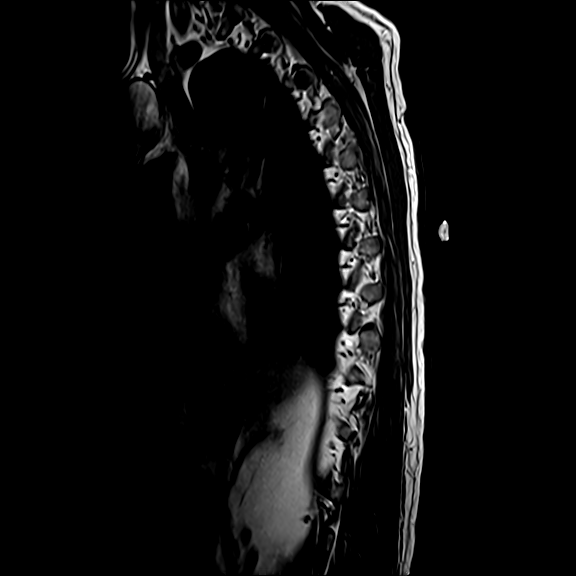
[im 3/18]
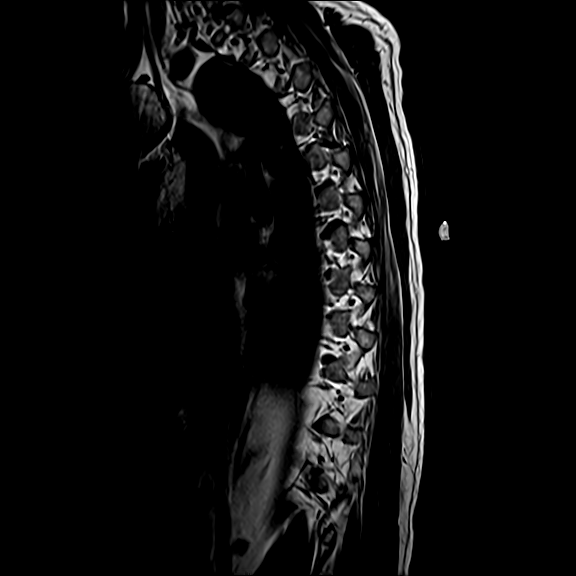
[im 6/18]
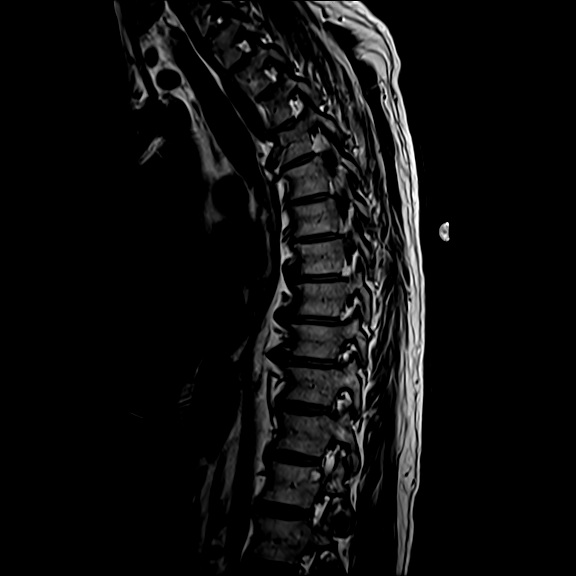
[im 9/18]
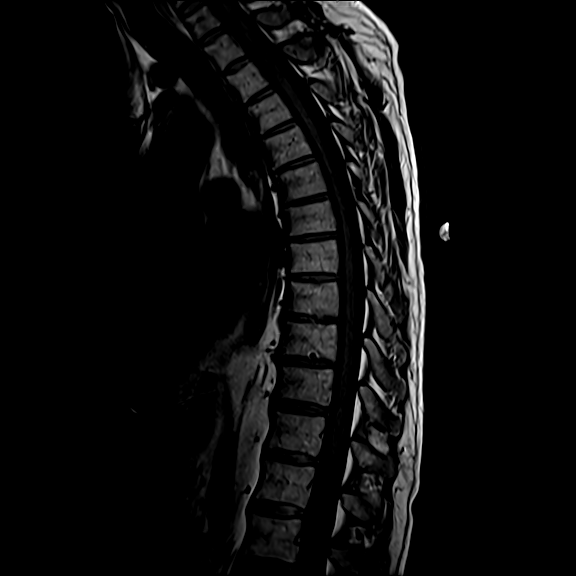
[im 15/18]
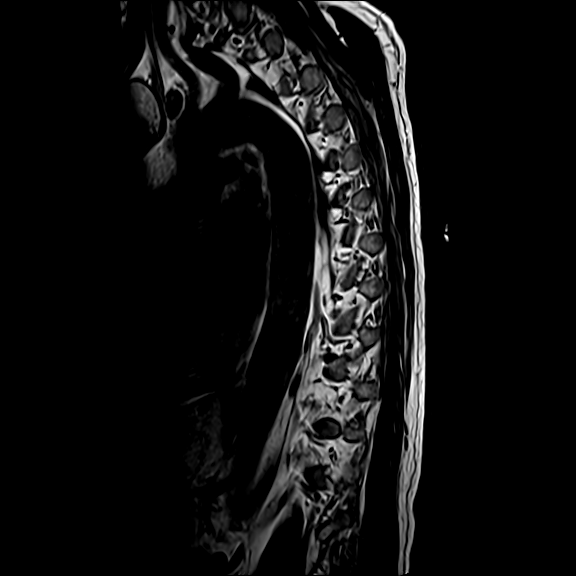

[Series 18: ir_sag · sagittal · 3.0mm · 0.68mm/px · 3 of 18 slices shown]
[im 3/18]
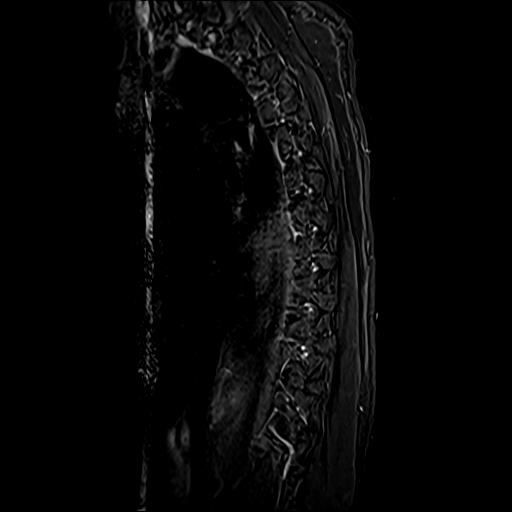
[im 9/18]
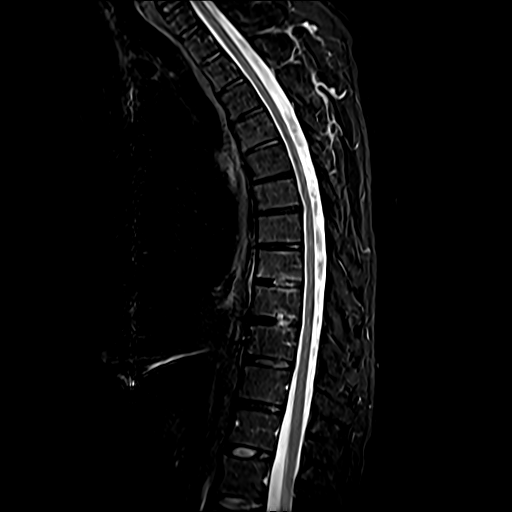
[im 15/18]
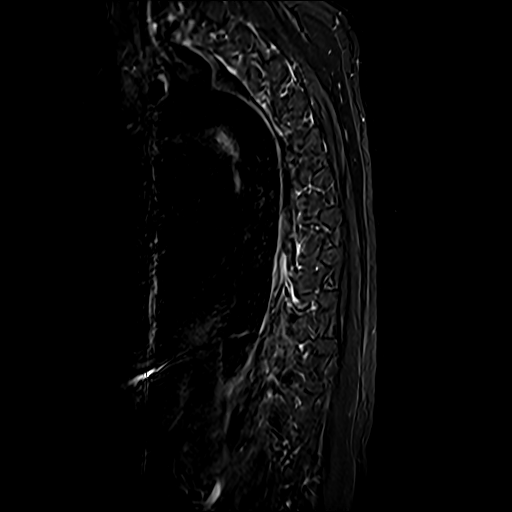

[Series 19: t2_axial · axial · 3.5mm · 0.39mm/px · z∈[-216,-39]mm · 3 of 74 slices shown]
[im 11/74]
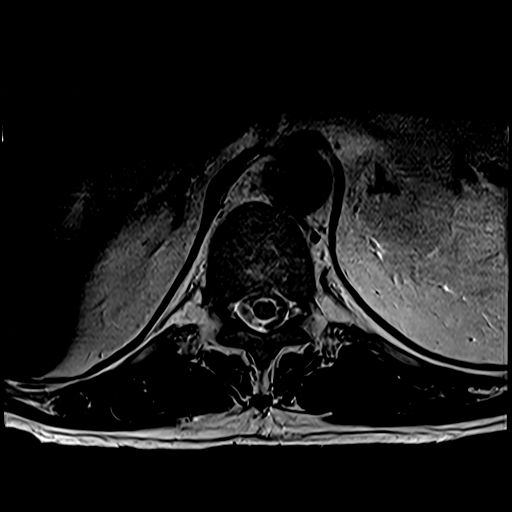
[im 38/74]
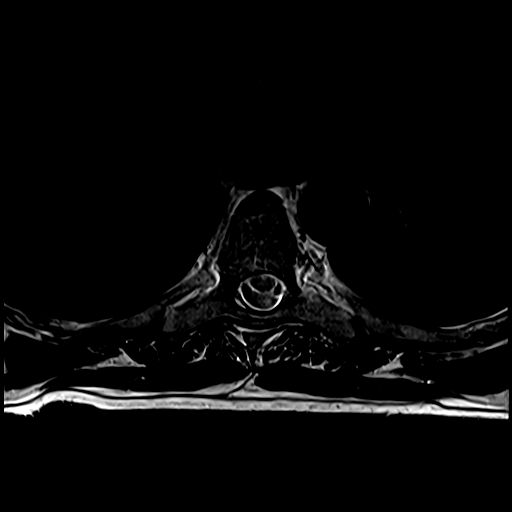
[im 63/74]
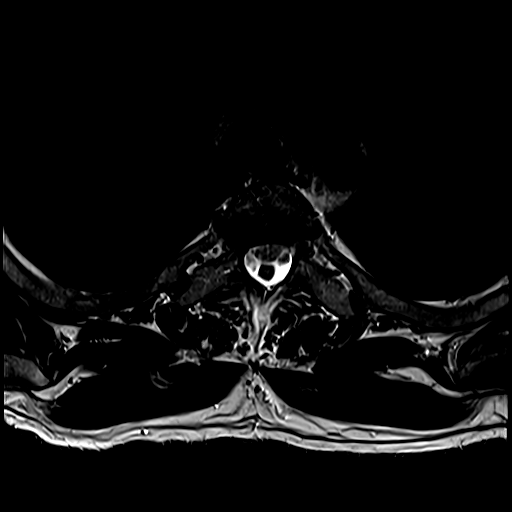

[17 of 48 positions shown; findings below may reference images not displayed]

FINDINGS: Vertebral body height and alignment: Within normal limits.
Marrow signal: The marrow signal is unremarkable.
Thoracic cord: Within normal limits.
T1-2: The spinal canal and the neural foramina are patent.
T2-3: The spinal canal and the neural foramina are patent.
T3-4: The spinal canal and the neural foramina are patent.
T4-5: The spinal canal and the neural foramina are patent.
T5-6: The spinal canal and the neural foramina are patent.
T6-7: The spinal canal and the neural foramina are patent.
T7-8: The spinal canal and the neural foramina are patent.
T8-9: The spinal canal and the neural foramina are patent.
T9-10: The spinal canal and the neural foramina are patent.
T10-11: The spinal canal and the neural foramina are patent.
T11-12: The spinal canal and the neural foramina are patent.
T12-L1: The spinal canal and the neural foramina are patent.
IMPRESSION: Negative thoracic spine study.

## 2022-05-28 IMAGING — MR MRI CSPINE WO CONTRAST
4 of 5 series · 18 of 48 positions shown · non-contrast
Comparison: None

Images Obtained from [facility]
INDICATION: Cervical spine pain.
TECHNIQUE: Sagittal multisequence and axial T2-weighted images of the cervical spine were performed without intravenous contrast.

[Series 5: t2_sag · sagittal · 3.0mm · 0.38mm/px · 6 of 16 slices shown]
[im 1/16]
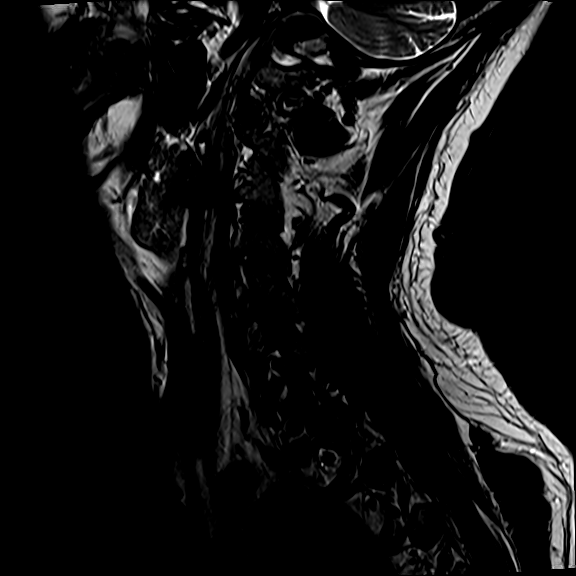
[im 4/16]
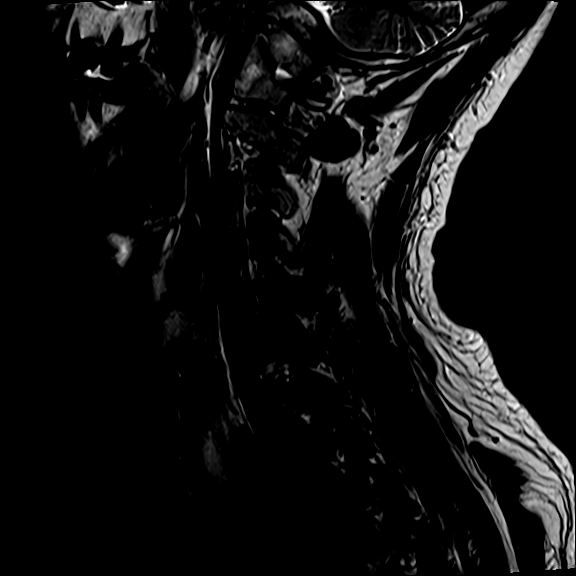
[im 7/16]
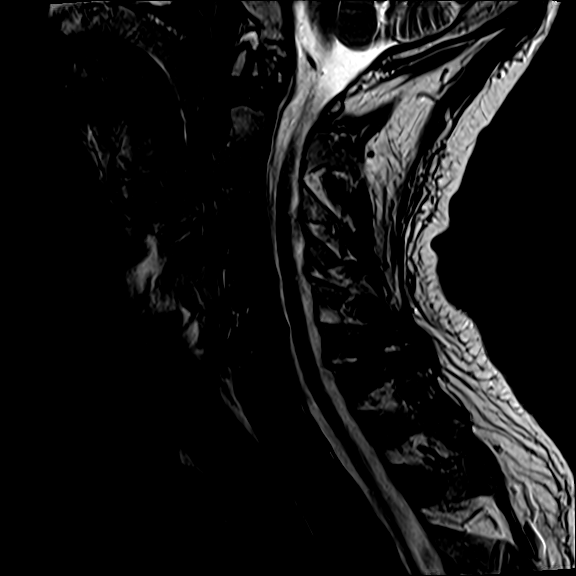
[im 10/16]
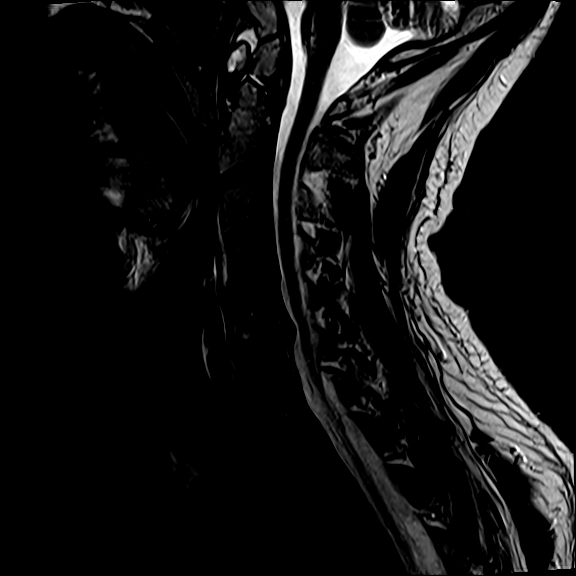
[im 13/16]
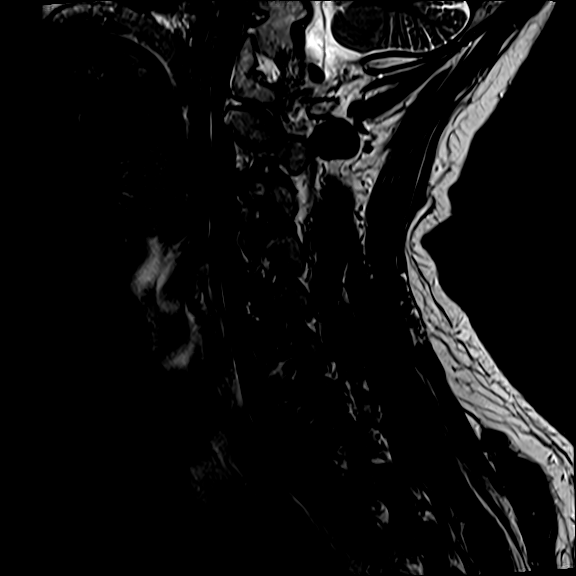
[im 16/16]
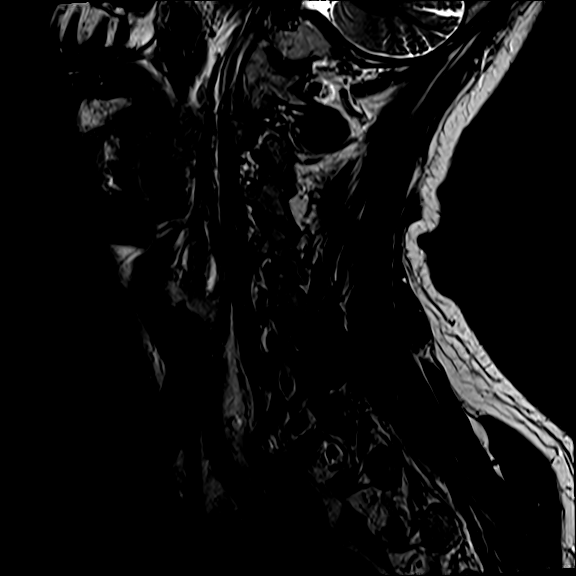

[Series 6: t1_sag · sagittal · 3.0mm · 0.43mm/px · 6 of 16 slices shown]
[im 1/16]
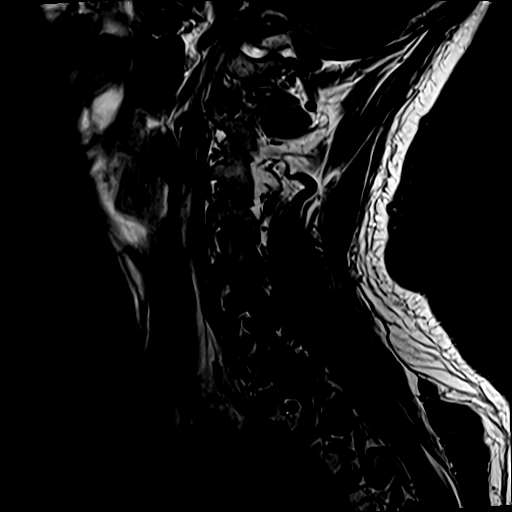
[im 3/16]
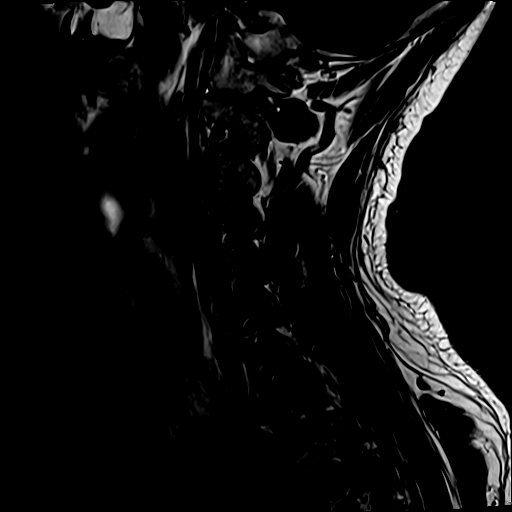
[im 6/16]
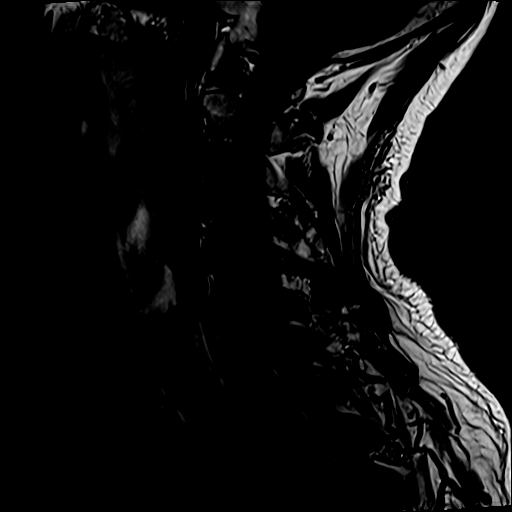
[im 8/16]
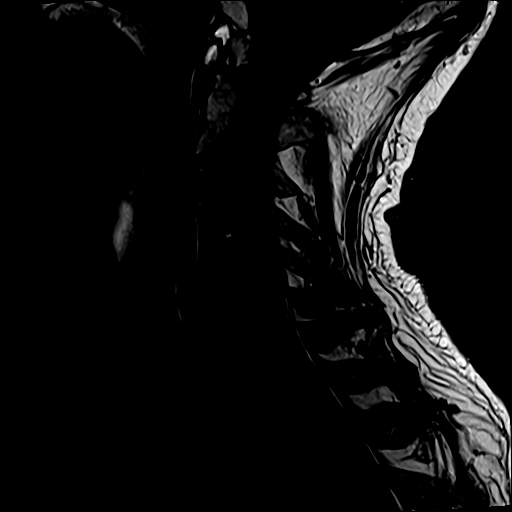
[im 11/16]
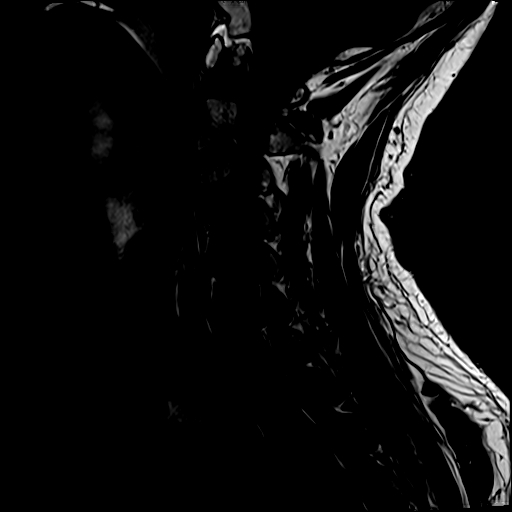
[im 13/16]
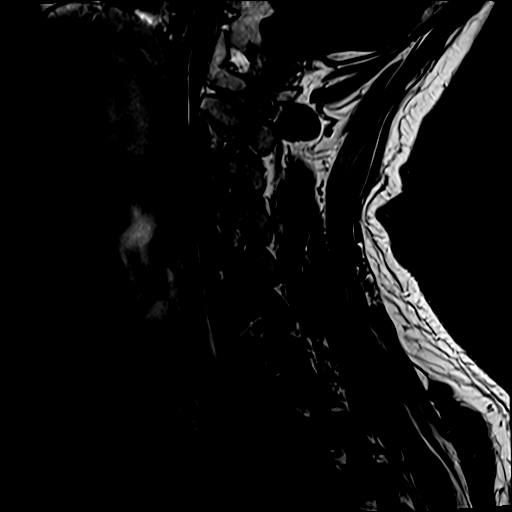

[Series 7: ir_sag · sagittal · 3.0mm · 0.43mm/px · 3 of 16 slices shown]
[im 3/16]
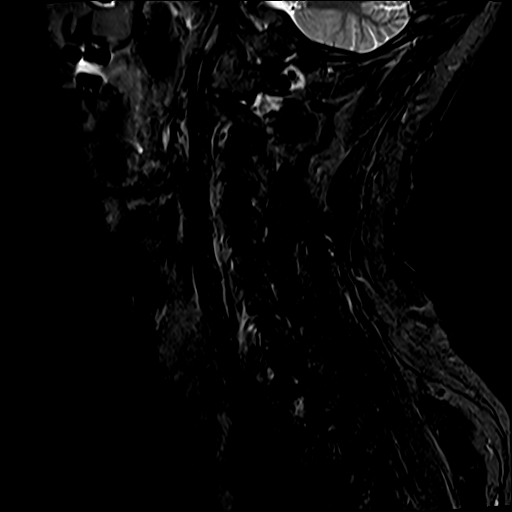
[im 8/16]
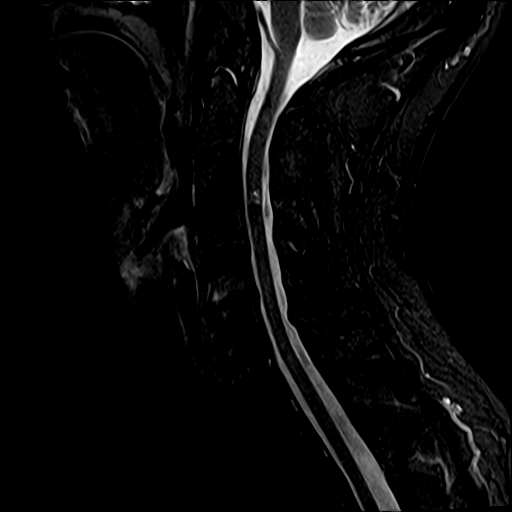
[im 13/16]
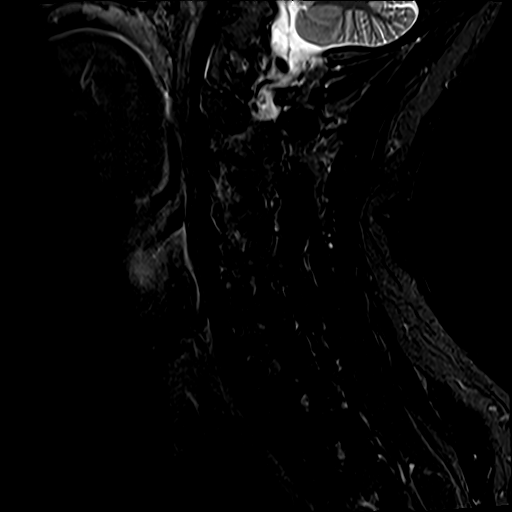

[Series 8: t2_medic_axial · axial · 3.0mm · 0.35mm/px · z∈[-5,+75]mm · 3 of 34 slices shown]
[im 6/34]
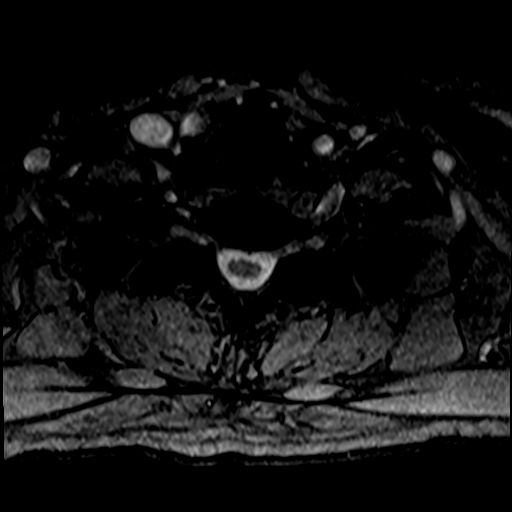
[im 18/34]
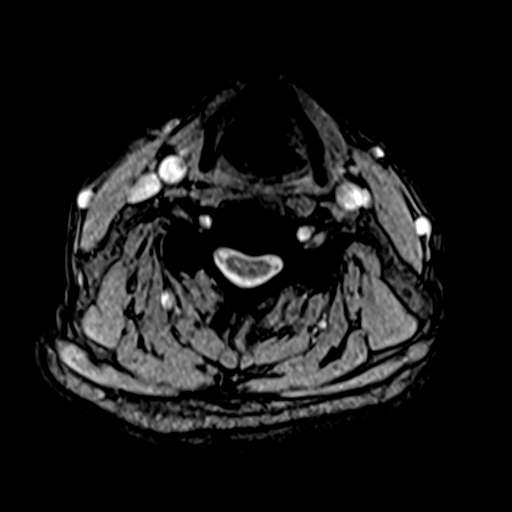
[im 28/34]
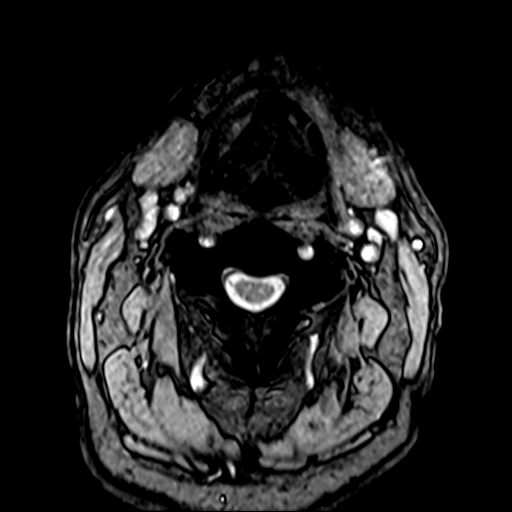

[18 of 48 positions shown; findings below may reference images not displayed]

FINDINGS: There is normal alignment of the vertebral elements with no anterior or posterior listhesis. There is os odontoideum with normal craniocervical junction
The signal intensity of the bone marrow, visualized brain, cervical and upper thoracic neural cord is normal.
At C2-C3, C3-C4 and C4-C5 there is no disc herniation, spinal canal or foraminal stenosis. Mild right C3-C4 and right C4-C5 facet arthropathy is seen.
At C5-C6 there is a left the paracentral disc protrusion projecting approximately 2 mm into the spinal canal indenting the dural sac with bilateral facet arthropathy. This produces mild spinal canal
stenoses without foraminal stenoses.
At C6-C7 there is a central disc bulge indenting the dural sac producing no significant spinal canal or foraminal stenosis.
At C7-T1, T1 due to and T2-T3 there is no disc herniation, spinal canal or foraminal stenosis.
IMPRESSION: Mild degenerative disc disease and facet arthropathy of the cervical spine. No significant spinal canal or foraminal stenosis is seen. Os odontoideum.

## 7363-07-15 DEATH — deceased
